# Patient Record
Sex: Male | Born: 1975 | Race: White | Hispanic: No | Marital: Single | Smoking: Current every day smoker
Health system: Southern US, Community
[De-identification: ages and names within clinical notes are randomized; demographics above are authoritative.]

## PROBLEM LIST (undated history)

## (undated) HISTORY — PX: FRACTURE SURGERY: SHX138

## (undated) HISTORY — PX: CHOLECYSTECTOMY: SHX55

## (undated) HISTORY — PX: OTHER SURGICAL HISTORY: SHX169

---

## 1998-09-20 ENCOUNTER — Emergency Department (HOSPITAL_COMMUNITY): Admission: EM | Admit: 1998-09-20 | Discharge: 1998-09-20 | Payer: Self-pay | Admitting: Emergency Medicine

## 1999-03-14 ENCOUNTER — Emergency Department (HOSPITAL_COMMUNITY): Admission: EM | Admit: 1999-03-14 | Discharge: 1999-03-14 | Payer: Self-pay | Admitting: *Deleted

## 1999-10-23 ENCOUNTER — Emergency Department (HOSPITAL_COMMUNITY): Admission: EM | Admit: 1999-10-23 | Discharge: 1999-10-23 | Payer: Self-pay | Admitting: Emergency Medicine

## 2000-02-05 ENCOUNTER — Emergency Department (HOSPITAL_COMMUNITY): Admission: EM | Admit: 2000-02-05 | Discharge: 2000-02-05 | Payer: Self-pay | Admitting: Emergency Medicine

## 2000-02-09 ENCOUNTER — Emergency Department (HOSPITAL_COMMUNITY): Admission: EM | Admit: 2000-02-09 | Discharge: 2000-02-09 | Payer: Self-pay | Admitting: Emergency Medicine

## 2000-08-21 ENCOUNTER — Emergency Department (HOSPITAL_COMMUNITY): Admission: EM | Admit: 2000-08-21 | Discharge: 2000-08-21 | Payer: Self-pay | Admitting: Emergency Medicine

## 2000-08-21 ENCOUNTER — Encounter: Payer: Self-pay | Admitting: Emergency Medicine

## 2000-10-31 ENCOUNTER — Encounter: Payer: Self-pay | Admitting: Emergency Medicine

## 2000-10-31 ENCOUNTER — Emergency Department (HOSPITAL_COMMUNITY): Admission: EM | Admit: 2000-10-31 | Discharge: 2000-10-31 | Payer: Self-pay | Admitting: Emergency Medicine

## 2001-08-20 ENCOUNTER — Emergency Department (HOSPITAL_COMMUNITY): Admission: EM | Admit: 2001-08-20 | Discharge: 2001-08-20 | Payer: Self-pay

## 2002-06-22 ENCOUNTER — Emergency Department (HOSPITAL_COMMUNITY): Admission: EM | Admit: 2002-06-22 | Discharge: 2002-06-22 | Payer: Self-pay | Admitting: Unknown Physician Specialty

## 2002-06-22 ENCOUNTER — Encounter: Payer: Self-pay | Admitting: Emergency Medicine

## 2002-07-24 ENCOUNTER — Emergency Department (HOSPITAL_COMMUNITY): Admission: EM | Admit: 2002-07-24 | Discharge: 2002-07-24 | Payer: Self-pay | Admitting: Emergency Medicine

## 2004-01-15 ENCOUNTER — Emergency Department (HOSPITAL_COMMUNITY): Admission: EM | Admit: 2004-01-15 | Discharge: 2004-01-15 | Payer: Self-pay | Admitting: Emergency Medicine

## 2004-03-19 ENCOUNTER — Emergency Department (HOSPITAL_COMMUNITY): Admission: EM | Admit: 2004-03-19 | Discharge: 2004-03-20 | Payer: Self-pay | Admitting: Emergency Medicine

## 2004-03-19 ENCOUNTER — Emergency Department (HOSPITAL_COMMUNITY): Admission: EM | Admit: 2004-03-19 | Discharge: 2004-03-19 | Payer: Self-pay | Admitting: Emergency Medicine

## 2004-06-18 ENCOUNTER — Emergency Department (HOSPITAL_COMMUNITY): Admission: EM | Admit: 2004-06-18 | Discharge: 2004-06-18 | Payer: Self-pay | Admitting: Emergency Medicine

## 2005-10-13 ENCOUNTER — Emergency Department (HOSPITAL_COMMUNITY): Admission: EM | Admit: 2005-10-13 | Discharge: 2005-10-13 | Payer: Self-pay | Admitting: Emergency Medicine

## 2006-03-21 ENCOUNTER — Emergency Department (HOSPITAL_COMMUNITY): Admission: EM | Admit: 2006-03-21 | Discharge: 2006-03-21 | Payer: Self-pay | Admitting: Emergency Medicine

## 2006-03-22 ENCOUNTER — Emergency Department (HOSPITAL_COMMUNITY): Admission: EM | Admit: 2006-03-22 | Discharge: 2006-03-22 | Payer: Self-pay | Admitting: Emergency Medicine

## 2006-10-14 ENCOUNTER — Emergency Department (HOSPITAL_COMMUNITY): Admission: EM | Admit: 2006-10-14 | Discharge: 2006-10-14 | Payer: Self-pay | Admitting: Emergency Medicine

## 2007-05-28 ENCOUNTER — Emergency Department (HOSPITAL_COMMUNITY): Admission: EM | Admit: 2007-05-28 | Discharge: 2007-05-28 | Payer: Self-pay | Admitting: Emergency Medicine

## 2007-11-11 ENCOUNTER — Emergency Department (HOSPITAL_COMMUNITY): Admission: EM | Admit: 2007-11-11 | Discharge: 2007-11-11 | Payer: Self-pay | Admitting: Emergency Medicine

## 2007-11-12 ENCOUNTER — Encounter (INDEPENDENT_AMBULATORY_CARE_PROVIDER_SITE_OTHER): Payer: Self-pay | Admitting: General Surgery

## 2007-11-12 ENCOUNTER — Observation Stay (HOSPITAL_COMMUNITY): Admission: EM | Admit: 2007-11-12 | Discharge: 2007-11-13 | Payer: Self-pay | Admitting: Emergency Medicine

## 2008-09-16 ENCOUNTER — Emergency Department (HOSPITAL_COMMUNITY): Admission: EM | Admit: 2008-09-16 | Discharge: 2008-09-17 | Payer: Self-pay | Admitting: Emergency Medicine

## 2008-09-21 ENCOUNTER — Encounter: Admission: RE | Admit: 2008-09-21 | Discharge: 2008-09-21 | Payer: Self-pay | Admitting: General Surgery

## 2008-09-22 ENCOUNTER — Encounter: Admission: RE | Admit: 2008-09-22 | Discharge: 2008-09-22 | Payer: Self-pay | Admitting: General Surgery

## 2009-01-21 ENCOUNTER — Emergency Department (HOSPITAL_COMMUNITY): Admission: EM | Admit: 2009-01-21 | Discharge: 2009-01-21 | Payer: Self-pay | Admitting: Emergency Medicine

## 2009-08-14 ENCOUNTER — Ambulatory Visit: Payer: Self-pay | Admitting: *Deleted

## 2009-08-14 ENCOUNTER — Emergency Department (HOSPITAL_COMMUNITY): Admission: EM | Admit: 2009-08-14 | Discharge: 2009-08-14 | Payer: Self-pay | Admitting: Emergency Medicine

## 2009-08-14 ENCOUNTER — Inpatient Hospital Stay (HOSPITAL_COMMUNITY): Admission: RE | Admit: 2009-08-14 | Discharge: 2009-08-17 | Payer: Self-pay | Admitting: *Deleted

## 2009-08-30 ENCOUNTER — Emergency Department (HOSPITAL_COMMUNITY): Admission: EM | Admit: 2009-08-30 | Discharge: 2009-08-30 | Payer: Self-pay | Admitting: Emergency Medicine

## 2009-09-01 ENCOUNTER — Emergency Department: Payer: Self-pay | Admitting: Emergency Medicine

## 2010-08-13 ENCOUNTER — Emergency Department (HOSPITAL_COMMUNITY): Admission: EM | Admit: 2010-08-13 | Discharge: 2010-08-13 | Payer: Self-pay | Admitting: Emergency Medicine

## 2011-04-06 LAB — CBC
HCT: 48.7 % (ref 39.0–52.0)
MCHC: 34.3 g/dL (ref 30.0–36.0)
MCV: 93 fL (ref 78.0–100.0)
Platelets: 320 10*3/uL (ref 150–400)
RBC: 5.24 MIL/uL (ref 4.22–5.81)
WBC: 12.6 10*3/uL — ABNORMAL HIGH (ref 4.0–10.5)

## 2011-04-06 LAB — ACETAMINOPHEN LEVEL: Acetaminophen (Tylenol), Serum: 25 ug/mL (ref 10–30)

## 2011-04-06 LAB — URINALYSIS, ROUTINE W REFLEX MICROSCOPIC
Bilirubin Urine: NEGATIVE
Glucose, UA: NEGATIVE mg/dL
Hgb urine dipstick: NEGATIVE
Ketones, ur: NEGATIVE mg/dL
Nitrite: NEGATIVE
Protein, ur: NEGATIVE mg/dL
Urobilinogen, UA: 0.2 mg/dL (ref 0.0–1.0)
pH: 6 (ref 5.0–8.0)

## 2011-04-06 LAB — COMPREHENSIVE METABOLIC PANEL
ALT: 22 U/L (ref 0–53)
AST: 21 U/L (ref 0–37)
Chloride: 105 mEq/L (ref 96–112)
Glucose, Bld: 85 mg/dL (ref 70–99)
Potassium: 4.1 mEq/L (ref 3.5–5.1)
Sodium: 139 mEq/L (ref 135–145)
Total Bilirubin: 0.4 mg/dL (ref 0.3–1.2)
Total Protein: 6.6 g/dL (ref 6.0–8.3)

## 2011-04-06 LAB — RAPID URINE DRUG SCREEN, HOSP PERFORMED
Amphetamines: NOT DETECTED
Barbiturates: NOT DETECTED
Cocaine: POSITIVE — AB
Opiates: NOT DETECTED

## 2011-04-06 LAB — DIFFERENTIAL
Eosinophils Relative: 1 % (ref 0–5)
Lymphocytes Relative: 20 % (ref 12–46)
Lymphs Abs: 2.6 10*3/uL (ref 0.7–4.0)

## 2011-04-27 ENCOUNTER — Emergency Department (HOSPITAL_COMMUNITY): Payer: Self-pay

## 2011-04-27 ENCOUNTER — Emergency Department (HOSPITAL_COMMUNITY)
Admission: EM | Admit: 2011-04-27 | Discharge: 2011-04-27 | Disposition: A | Discharge status: Home / Self Care | Payer: Self-pay | Attending: Emergency Medicine | Admitting: Emergency Medicine

## 2011-04-27 DIAGNOSIS — M545 Low back pain, unspecified: Secondary | ICD-10-CM | POA: Insufficient documentation

## 2011-04-27 DIAGNOSIS — Y929 Unspecified place or not applicable: Secondary | ICD-10-CM | POA: Insufficient documentation

## 2011-04-27 DIAGNOSIS — M546 Pain in thoracic spine: Secondary | ICD-10-CM | POA: Insufficient documentation

## 2011-04-27 DIAGNOSIS — W010XXA Fall on same level from slipping, tripping and stumbling without subsequent striking against object, initial encounter: Secondary | ICD-10-CM | POA: Insufficient documentation

## 2011-05-13 ENCOUNTER — Emergency Department (HOSPITAL_COMMUNITY)
Admission: EM | Admit: 2011-05-13 | Discharge: 2011-05-13 | Disposition: A | Discharge status: Home / Self Care | Payer: Self-pay | Attending: Emergency Medicine | Admitting: Emergency Medicine

## 2011-05-13 DIAGNOSIS — R51 Headache: Secondary | ICD-10-CM | POA: Insufficient documentation

## 2011-05-13 DIAGNOSIS — R220 Localized swelling, mass and lump, head: Secondary | ICD-10-CM | POA: Insufficient documentation

## 2011-05-13 DIAGNOSIS — W540XXA Bitten by dog, initial encounter: Secondary | ICD-10-CM | POA: Insufficient documentation

## 2011-05-13 DIAGNOSIS — R221 Localized swelling, mass and lump, neck: Secondary | ICD-10-CM | POA: Insufficient documentation

## 2011-05-13 DIAGNOSIS — Y929 Unspecified place or not applicable: Secondary | ICD-10-CM | POA: Insufficient documentation

## 2011-05-13 DIAGNOSIS — R234 Changes in skin texture: Secondary | ICD-10-CM | POA: Insufficient documentation

## 2011-05-13 LAB — DIFFERENTIAL
Basophils Absolute: 0.1 10*3/uL (ref 0.0–0.1)
Eosinophils Relative: 1 % (ref 0–5)
Neutrophils Relative %: 79 % — ABNORMAL HIGH (ref 43–77)

## 2011-05-13 LAB — BASIC METABOLIC PANEL
Chloride: 104 mEq/L (ref 96–112)
GFR calc Af Amer: >60 mL/min (ref >60)
Potassium: 4.2 mEq/L (ref 3.5–5.1)

## 2011-05-13 LAB — CBC
Hemoglobin: 16.5 g/dL (ref 13.0–17.0)
MCV: 90.2 fL (ref 78.0–100.0)
Platelets: 282 10*3/uL (ref 150–400)
RBC: 5.09 MIL/uL (ref 4.22–5.81)
WBC: 13 10*3/uL — ABNORMAL HIGH (ref 4.0–10.5)

## 2011-05-14 NOTE — Discharge Summary (Signed)
NAME:  B[place]man, [place]               ACCOUNT NO.:  [id number]   MEDICAL RECORD NO.:  [id number]          PATIENT TYPE:  IPS   LOCATION:  0305                          FACILITY:  [facility]   PHYSICIAN:  Jasmine Pang, M.D. DATE OF BIRTH:  03-02-1976   DATE OF ADMISSION:  08/14/2009  DATE OF DISCHARGE:  08/17/2009                               DISCHARGE SUMMARY   IDENTIFICATION:  This is a 35 year old single white male from  [place], who was admitted on August 14, 2009.   HISTORY OF PRESENT ILLNESS:  The patient is depressed with overdose on  sleep medications (over-the-counter).  He is stressed about the loss of  his son several years ago.  He works for [employer], which he stated very  stressful.  He has been depressed for several months it is worsened  recently.  He is suffering from anxiety.  He is also having mood swings  in addition to his depression.  He states he gets angry very quickly.  He went to [facility] in the past.  He uses alcohol 6-12 beers  daily and there is some occasional cocaine use.  He also complains of  some racing thoughts.  For further admission information, see  psychiatric admission assessment.  This is the first [facility] admission for  the patient.  He is in no current outpatient treatment.  Initially, he  was given an Axis I diagnosis of mood disorder NOS and also cocaine  abuse and alcohol abuse.  He is also given an Axis III diagnosis of a  history of headaches and history of ulcers.   PHYSICAL FINDINGS:  There were no acute physical abnormalities noted.  He was fully assessed in the ED prior to transfer here.   ADMISSION LABORATORY:  UDS was positive for cocaine.  Urinalysis was  negative.  Alcohol level was 8.  CBC was within normal limits.  CMET was  within normal limits.   HOSPITAL COURSE:  Upon admission, the patient was started on Risperdal 5  mg in the morning and 2 mg at h.s. due to his complaints, Librium 25 mg  p.o. q.4 h. p.r.n. anxiety or  withdrawal and Ambien 10 mg p.o. q.h.s. 1-  2 pills at bedtime if needed.  He was also started on Neurontin 100 mg  p.o. q.4 h. p.r.n. anxiety and Depakote 500 mg p.o. q.h.s.  He was also  started on Seroquel 50 mg p.o. q.4 h. p.r.n. anxiety.  Initially, the  patient presented as a somewhat depressed and anxious male.  He had  positive suicidal ideation upon admission, but this was resolving.  There was no homicidal ideation.  No evidence of a thought disorder or  paranoia.  On August 16, 2009, he stated he still not feeling well  sometimes I feel there are 2 with me.  He discussed hearing voices,  but admitted that he did not think they were real that it was more his  conversation with himself.  His mood was somewhat less depressed, less  anxious.  As hospitalization progressed, mental status improved.  He  stated I have a place  to go.  He wanted discharge on August 17, 2009.  Case manager gave him contact information to schedule an appointment at  [facility] for therapy as well as information regarding vocational  rehab in the [facility] for support and the [facility] warm line for peer  support.  His mood was less depressed, less anxious.  Affect was  consistent with mood.  There was no suicidal or homicidal ideation.  No  thoughts of self-injurious behavior.  No auditory or visual  hallucinations.  No paranoia or delusions.  Thoughts were logical and  goal-directed.  Thought content, no predominant theme.  Cognitive was  grossly intact.  Insight good.  Judgment good.  Impulse control good.  The patient wanted to go home today and was felt to be safe for  discharge.   DISCHARGE DIAGNOSES:  Axis I:  Mood disorder not otherwise specified and  also polysubstance abuse.  Axis II:  None.  Axis III:  History of headache and history of ulcer.  Axis IV:  Severe (problems with primary support group, problems related  to social environment, occupational problem, poor support, grief over  loss  of his son, burden of psychiatric and chemical abuse illness).  Axis V:  Global assessment of functioning was 50 at discharge.  GAF was  35 upon admission.  GAF was 60 highest past year.   DISCHARGE PLAN:  There was no specific activity level or dietary  restrictions.   POSTHOSPITAL CARE PLAN:  The patient is to go to 90 meetings in 90 days.  The patient will go to the [facility] on August 21, 2009 at 3  o'clock p.m.  He is also to call [facility] to schedule counseling.   DISCHARGE MEDICATIONS:  1. Depakote 500 mg at bedtime.  2. Risperdal 1 mg one-half tablet in the morning and 2 tablets at      bedtime.  3. Neurontin 100 mg every 4 hours as needed.      Jasmine Pang, M.D.  Electronically Signed     BHS/MEDQ  D:  08/29/2009  T:  08/30/2009  Job:  [id number]

## 2011-05-14 NOTE — H&P (Signed)
NAME:  Poehler, [place]               ACCOUNT NO.:  [id number]   MEDICAL RECORD NO.:  [id number]         PATIENT TYPE:  LINP   LOCATION:                               FACILITY:  [facility]   PHYSICIAN:  Lennie Muckle, MD      DATE OF BIRTH:  01-03-1976   DATE OF ADMISSION:  11/12/2007  DATE OF DISCHARGE:                              HISTORY & PHYSICAL   Mr. [name] Ramsey is a 35 year old male who was originally seen in the  emergency department yesterday at [facility] due to abdominal  and right upper quadrant pain.  He states he ate a ham biscuit from  [place] around 4 in the morning.  Twenty to thirty minutes later he  had severe abdominal pain.  On his arrival in the emergency room, he had  serum chemistries drawn which had a sodium and potassium of 141 and 5.2.  BUN and creatinine of 7 and 0.7.  ALT AST are normal at 21 and 24 and  lipase was normal.  White count was elevated at 19, hemoglobin and  hematocrit were 17 and 49.7.  He did receive IV pain medication.  However, due to his social situation he had to return home to take care  of his family.  He says he continued to have abdominal pain throughout  the day and returned back to the emergency room due to the continuation  of his abdominal pain.   He does describe intermittent episodes of right upper quadrant abdominal  pain after eating fried or greasy foods within the past month.  He does  have some nausea, no emesis, no diarrhea.  He has had no episodes of  jaundice or yellowing of the skin.  He does have a history of kidney  stones but relates that this pain is different.  He reports having no  fevers at home.  He did have an ultrasound performed in the emergency  department which showed multiple gallstones, a thickened gallbladder  wall, no pericholecystic fluid and there was a question of a starry-type  appearance to the liver consistent with hepatitis.   PAST MEDICAL HISTORY:  History of depression.   SURGICAL  HISTORY:  Ankle surgery.   He takes no daily medications.   He has no drug allergies.   SOCIAL HISTORY:  He smokes approximately 1/2 to 1 pack a day, occasional  alcohol use.   REVIEW OF SYSTEMS:  Obtained from the patient in the patient's emergency  room chart.  A 12-point system is negative.   PHYSICAL EXAMINATION:  GENERAL:  He is laying on a stretcher in no acute  distress.  He appears his stated age.  HEENT:  Extraocular muscles are intact.  No scleral icterus is evident.  NECK:  Supple.  CHEST:  Clear to auscultation bilaterally.  CARDIOVASCULAR:  Regular rate and rhythm.  ABDOMEN:  Soft.  A minimal amount of tenderness with palpation in the  epigastrium and right upper quadrant.  No peritoneal signs are noted.  No masses are palpated.  EXTREMITIES:  Without edema or deformity.  SKIN:  No rashes.  Skin  is warm and dry.  He has multiple tattoos on the  upper extremities, right and left.   A repeat white count today this morning at approximately 4 a.m. revealed  a white count of 20.6, hemoglobin and hematocrit of 17 and 50, platelets  of 394.   ASSESSMENT/PLAN:  Cholecystitis with cholelithiasis.  I discussed with  Mr. Lust that he needs to receive a cholecystectomy for true  definitive treatment of his disease process.  The risk of the surgery,  laparoscopic cholecystectomy with intraoperative cholangiogram, were  discussed with Mr. Yon.  Hopefully, he will be able to have this  performed by one of my partners today or in the near future.  His pain  has been relieved with Dilaudid and he has received Zofran.  We will  admit him with intravenous antibiotics and hopefully have him on the  operating room schedule today.  I will discuss this with Dr. Zachery Dakins  and my other partners to try and facilitate this.  I did discuss the  risk of hepatitis with the patient.  He does not have any intravenous  drug use history, no history of transfusions, and he does have a  tattoo  history.  I related to him that this will need to be monitored with  possibly having hepatitis panel drawn.  I will go ahead and draw a  hepatitis panel with this admission.      Lennie Muckle, MD  Electronically Signed     ALA/MEDQ  D:  11/12/2007  T:  11/12/2007  Job:  [id number]
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
## 2011-05-29 NOTE — Consult Note (Signed)
  NAME:  M[place], [place]               ACCOUNT NO.:  [id number]  MEDICAL RECORD NO.:  [id number]           PATIENT TYPE:  E  LOCATION:  [facility]                         FACILITY:  [facility]  PHYSICIAN:  Khyleigh Furney H. Pollyann Kennedy, MD     DATE OF BIRTH:  1976/10/16  DATE OF CONSULTATION:  05/13/2011 DATE OF DISCHARGE:  05/13/2011                                CONSULTATION   REASON FOR CONSULTATION:  Dog bite to the face 5 days ago and significant pain.  HISTORY:  This is a 36 year old who was bit in the upper lip by his boss' dog on Wednesday of last week.  He was treated at the [facility] Emergency Department by plastic surgeon who repaired a complex defect of the upper lip and nose.  He has been on Augmentin ever since.  He typically smokes, but has not smoked since this occurred and has been having trouble eating, but has been able to drink liquids.  No past medical history.  No significant surgical history.  PHYSICAL EXAMINATION:  Healthy-appearing gentleman in no distress.  His exam is limited to the face.  His upper lip is swollen with large amount of scabs on the outer surface.  There is no erythema of the skin, and there is no evidence of purulent discharge or fluctuance.  There are some abrasions of the nasal tip and some scabbing inside the nasal vestibule on the left.  The remainder of the face is unremarkable. Sutures are not visible.  IMPRESSION:  Status post complex laceration of the upper lip secondary to dog bite and status post repair of this by physician at [facility].  He lives in [place] and works in [place], so he would rather follow up here since he is not working currently.  He also did not very much like the plastic surgeon who worked on him, and he could not afford the co-paid followup in his office for additional treatment.  I offered that I would see him back in the office later in the week and if possible, remove the sutures.  I would not remove  them today because I think it is too soon, and he is extremely tender in the area.  I think it will be very difficult.  He is instructed to complete his Augmentin.  I have also instructed him on wound care including cleaning the skin with peroxide and applying a lot of antibiotic ointment.  He will follow up later in the week.  The emergency department staff will provide a prescription for some Vicodin, #30.     Teah Votaw H. Pollyann Kennedy, MD     JHR/MEDQ  D:  05/13/2011  T:  05/14/2011  Job:  [id number]  Electronically Signed by Serena Colonel MD on 05/29/2011 07:47:02 AM

## 2011-10-08 LAB — HEPATIC FUNCTION PANEL
ALT: 91 — ABNORMAL HIGH
AST: 71 — ABNORMAL HIGH
Albumin: 3.7
Bilirubin, Direct: 0.4 — ABNORMAL HIGH
Total Protein: 6.2

## 2011-10-08 LAB — DIFFERENTIAL
Basophils Absolute: 0
Basophils Absolute: 0.1
Eosinophils Relative: 0
Lymphocytes Relative: 7 — ABNORMAL LOW
Lymphocytes Relative: 9 — ABNORMAL LOW
Monocytes Absolute: 1.3 — ABNORMAL HIGH
Neutro Abs: 17 — ABNORMAL HIGH
Neutro Abs: 17.5 — ABNORMAL HIGH

## 2011-10-08 LAB — CBC
HCT: 45.7
HCT: 49.7
Hemoglobin: 15.9
Hemoglobin: 17.3 — ABNORMAL HIGH
MCHC: 34.8
MCV: 90.5
MCV: 90.9
RBC: 5.49
RDW: 14
RDW: 14.1
WBC: 19.1 — ABNORMAL HIGH

## 2011-10-08 LAB — COMPREHENSIVE METABOLIC PANEL
BUN: 7
CO2: 27
Chloride: 107
Creatinine, Ser: 0.76
GFR calc non Af Amer: >60
Glucose, Bld: 100 — ABNORMAL HIGH
Total Bilirubin: 0.8

## 2011-10-08 LAB — LIPASE, BLOOD: Lipase: 14

## 2012-02-03 ENCOUNTER — Encounter (HOSPITAL_COMMUNITY): Payer: Self-pay | Admitting: Emergency Medicine

## 2012-02-03 ENCOUNTER — Emergency Department (HOSPITAL_COMMUNITY)
Admission: EM | Admit: 2012-02-03 | Discharge: 2012-02-03 | Disposition: A | Discharge status: Home / Self Care | Payer: Self-pay | Attending: Emergency Medicine | Admitting: Emergency Medicine

## 2012-02-03 DIAGNOSIS — L0501 Pilonidal cyst with abscess: Secondary | ICD-10-CM | POA: Insufficient documentation

## 2012-02-03 DIAGNOSIS — IMO0001 Reserved for inherently not codable concepts without codable children: Secondary | ICD-10-CM | POA: Insufficient documentation

## 2012-02-03 MED ORDER — DOXYCYCLINE HYCLATE 100 MG PO CAPS: 100.0000 mg | ORAL_CAPSULE | Freq: Two times a day (BID) | ORAL | Status: DC

## 2012-02-03 MED ORDER — HYDROCODONE-ACETAMINOPHEN 5-325 MG PO TABS
1.0000 | ORAL_TABLET | Freq: Once | ORAL | Status: AC
  Administered 2012-02-03: 1 via ORAL
  Filled 2012-02-03: qty 1

## 2012-02-03 MED ORDER — HYDROCODONE-ACETAMINOPHEN 5-325 MG PO TABS: 1.0000 | ORAL_TABLET | ORAL | Status: DC | PRN

## 2012-02-03 NOTE — ED Provider Notes (Signed)
History     CSN: [id number]  Arrival date & time 02/03/12  1710   First MD Initiated Contact with Patient 02/03/12 2001      Chief Complaint  Patient presents with  . Abscess    (Consider location/radiation/quality/duration/timing/severity/associated sxs/prior treatment) HPI Comments: Patient with a history of frequent pilonidal abscesses - states they usually occur about once every 4-5 years - states recently they have been opening and draining on their own, but this one has been there about 2 weeks and has not drained - states increasing pain.  Patient is a 36 y.o. male presenting with abscess. The history is provided by the patient. No language interpreter was used.  Abscess  This is a new problem. The current episode started less than one week ago. The onset was gradual. The problem occurs occasionally. The problem has been unchanged. The abscess is present on the back. The problem is moderate. The abscess is characterized by redness, painfulness and swelling. The patient was exposed to OTC medications. The abscess first occurred at home. Associated symptoms include decreased sleep. Pertinent negatives include no anorexia, no decrease in physical activity, not drinking less, no fever, not sleeping more, no diarrhea, no vomiting, no congestion, no rhinorrhea, no sore throat, no decreased responsiveness and no cough. His past medical history is significant for skin abscesses in family. There were no sick contacts. He has received no recent medical care.    Past Medical History  Diagnosis Date  . Gastric ulcer     Past Surgical History  Procedure Date  . Fracture surgery   . Cholecystectomy     No family history on file.  History  Substance Use Topics  . Smoking status: Current Everyday Smoker  . Smokeless tobacco: Not on file  . Alcohol Use: No      Review of Systems  Constitutional: Negative for fever and decreased responsiveness.  HENT: Negative for congestion, sore  throat and rhinorrhea.   Respiratory: Negative for cough.   Gastrointestinal: Negative for vomiting, diarrhea and anorexia.  All other systems reviewed and are negative.    Allergies  Aspirin  Home Medications  No current outpatient prescriptions on file.  BP 128/68  Pulse 71  Temp(Src) 98.5 F (36.9 C) (Oral)  Resp 17  SpO2 97%  Physical Exam  Nursing note and vitals reviewed. Constitutional: He is oriented to person, place, and time. He appears well-developed and well-nourished. No distress.  HENT:  Head: Normocephalic and atraumatic.  Right Ear: External ear normal.  Left Ear: External ear normal.  Nose: Nose normal.  Mouth/Throat: Oropharynx is clear and moist. No oropharyngeal exudate.  Eyes: Conjunctivae are normal. Pupils are equal, round, and reactive to light. No scleral icterus.  Neck: Normal range of motion. Neck supple.  Cardiovascular: Normal rate, regular rhythm and normal heart sounds.  Exam reveals no gallop and no friction rub.   No murmur heard. Pulmonary/Chest: Effort normal and breath sounds normal. No respiratory distress.  Abdominal: Soft. Bowel sounds are normal. He exhibits no distension. There is no tenderness.  Musculoskeletal: Normal range of motion.  Lymphadenopathy:    He has no cervical adenopathy.  Neurological: He is alert and oriented to person, place, and time. No cranial nerve deficit.  Skin: Skin is warm and dry.     Psychiatric: He has a normal mood and affect. His behavior is normal. Judgment and thought content normal.    ED Course  Procedures (including critical care time)  Labs Reviewed - No data  to display No results found.  INCISION AND DRAINAGE Performed by: Cherrie Distance C. Consent: Verbal consent obtained. Risks and benefits: risks, benefits and alternatives were discussed Type: abscess  Body area: coccyx  Anesthesia: local infiltration  Local anesthetic: lidocaine 2% without epinephrine  Anesthetic total:  5 ml  Complexity: complex Blunt dissection to break up loculations  Drainage: purulent  Drainage amount: large  Packing material: 1/4 in iodoform gauze  Patient tolerance: Patient tolerated the procedure well with no immediate complications.  & Pilonidal abscess    MDM  Patient with complex pilonidal abscess, has history of same, no evidence of cellulitis or tracking to perirectal area, I&D with large drainage - wound packed and will return in 2 days for re-evaluation.        Izola Price [place], [place] 02/03/12 2201

## 2012-02-03 NOTE — ED Notes (Signed)
Report given to CDU RN; notified ED PA that pt is in CDU 9 and I&D tray at bedside

## 2012-02-03 NOTE — ED Notes (Signed)
Pt c/o abscess on coccyx area for approx 2 weeks.  St's has not had any drainage

## 2012-02-04 NOTE — ED Provider Notes (Signed)
Medical screening examination/treatment/procedure(s) were conducted as a shared visit with non-physician practitioner(s) and myself.  I personally evaluated the patient during the encounter    Celene Kras, MD 02/04/12 1058

## 2012-02-05 ENCOUNTER — Emergency Department (HOSPITAL_COMMUNITY)
Admission: EM | Admit: 2012-02-05 | Discharge: 2012-02-05 | Disposition: A | Discharge status: Home / Self Care | Payer: Self-pay | Attending: Emergency Medicine | Admitting: Emergency Medicine

## 2012-02-05 ENCOUNTER — Encounter (HOSPITAL_COMMUNITY): Payer: Self-pay | Admitting: *Deleted

## 2012-02-05 DIAGNOSIS — Z09 Encounter for follow-up examination after completed treatment for conditions other than malignant neoplasm: Secondary | ICD-10-CM | POA: Insufficient documentation

## 2012-02-05 DIAGNOSIS — Z4800 Encounter for change or removal of nonsurgical wound dressing: Secondary | ICD-10-CM | POA: Insufficient documentation

## 2012-02-05 DIAGNOSIS — Z5189 Encounter for other specified aftercare: Secondary | ICD-10-CM

## 2012-02-05 NOTE — ED Provider Notes (Signed)
Medical screening examination/treatment/procedure(s) were performed by non-physician practitioner and as supervising physician I was immediately available for consultation/collaboration.  Gerhard Munch, MD 02/05/12 2049

## 2012-02-05 NOTE — ED Notes (Signed)
Pt here for abscess follow up.  Reports that he was treated on Monday for the abscess above his buttocks.  No distress noted,  Reports that the pain medication is not working.

## 2012-02-05 NOTE — ED Provider Notes (Signed)
History     CSN: [id number]  Arrival date & time 02/05/12  1949   First MD Initiated Contact with Patient 02/05/12 2008      Chief Complaint  Patient presents with  . Abscess    (Consider location/radiation/quality/duration/timing/severity/associated sxs/prior treatment) HPI Comments: Patient is here to have packing removed from an abscess that was I&D 3 days ago on his  Patient is a 36 y.o. male presenting with abscess. The history is provided by the patient. The history is limited by a language barrier.  Abscess  The current episode started less than one week ago. The abscess is present on the back. The problem is mild. Pertinent negatives include no fever.    Past Medical History  Diagnosis Date  . Gastric ulcer     Past Surgical History  Procedure Date  . Fracture surgery   . Cholecystectomy     History reviewed. No pertinent family history.  History  Substance Use Topics  . Smoking status: Current Everyday Smoker  . Smokeless tobacco: Not on file  . Alcohol Use: No      Review of Systems  Constitutional: Negative for fever.  Genitourinary: Negative for flank pain.  Neurological: Negative for dizziness.    Allergies  Aspirin  Home Medications   Current Outpatient Rx  Name Route Sig Dispense Refill  . DOXYCYCLINE HYCLATE 100 MG PO CAPS Oral Take 100 mg by mouth 2 (two) times daily.    Marland Kitchen HYDROCODONE-ACETAMINOPHEN 5-325 MG PO TABS Oral Take 1 tablet by mouth every 4 (four) hours as needed. For pain.      BP 106/66  Pulse 60  Temp(Src) 97.9 F (36.6 C) (Oral)  Resp 16  SpO2 98%  Physical Exam  Constitutional: He appears well-developed and well-nourished.  HENT:  Head: Normocephalic.  Neck: Normal range of motion.  Cardiovascular: Normal rate.   Pulmonary/Chest: Effort normal.  Musculoskeletal: Normal range of motion.  Neurological: He is alert.  Skin:       Healing abscess drain remain in minimal redness.  Clear, serous drainage    ED  Course  Procedures (including critical care time)  Labs Reviewed - No data to display No results found.   1. Wound check, abscess       MDM   I&D abscess, recheck        Arman Filter, NP 02/05/12 2037

## 2012-03-17 ENCOUNTER — Encounter (HOSPITAL_COMMUNITY): Payer: Self-pay

## 2012-03-17 ENCOUNTER — Emergency Department (HOSPITAL_COMMUNITY)
Admission: EM | Admit: 2012-03-17 | Discharge: 2012-03-17 | Disposition: A | Discharge status: Home / Self Care | Payer: Self-pay | Attending: Emergency Medicine | Admitting: Emergency Medicine

## 2012-03-17 DIAGNOSIS — L0231 Cutaneous abscess of buttock: Secondary | ICD-10-CM | POA: Insufficient documentation

## 2012-03-17 DIAGNOSIS — L0291 Cutaneous abscess, unspecified: Secondary | ICD-10-CM

## 2012-03-17 MED ORDER — OXYCODONE-ACETAMINOPHEN 5-325 MG PO TABS: 2.0000 | ORAL_TABLET | ORAL | Status: AC | PRN

## 2012-03-17 MED ORDER — SULFAMETHOXAZOLE-TRIMETHOPRIM 800-160 MG PO TABS: 1.0000 | ORAL_TABLET | Freq: Two times a day (BID) | ORAL | Status: AC

## 2012-03-17 NOTE — ED Notes (Signed)
Pt. Just has an abscess treated recently on his buttocks it has returned.  Large red, swollen and started draining this am

## 2012-03-17 NOTE — ED Provider Notes (Signed)
History     CSN: [id number]  Arrival date & time 03/17/12  1710   First MD Initiated Contact with Patient 03/17/12 1937      Chief Complaint  Patient presents with  . Abscess    (Consider location/radiation/quality/duration/timing/severity/associated sxs/prior treatment) Patient is a 36 y.o. male presenting with abscess. The history is provided by the patient and a friend. No language interpreter was used.  Abscess  This is a recurrent problem. The current episode started yesterday. The onset was gradual. The problem has been gradually improving. The abscess is present on the right buttock. The problem is mild. The abscess is characterized by painfulness. The abscess first occurred at home. Pertinent negatives include no decrease in physical activity, no fever, no diarrhea and no vomiting.   Patient was here one month ago with a abscess to the same area between his buttock cheeks. Area today is 1 cm in size and no erythema present,  is cool to touch with a scab. No drainage visible.   Pt states the area drained on its own last pm a moderate amount.  Past Medical History  Diagnosis Date  . Gastric ulcer     Past Surgical History  Procedure Date  . Fracture surgery   . Cholecystectomy     No family history on file.  History  Substance Use Topics  . Smoking status: Current Everyday Smoker  . Smokeless tobacco: Not on file  . Alcohol Use: No      Review of Systems  Constitutional: Negative for fever and diaphoresis.  Respiratory: Negative for shortness of breath.   Gastrointestinal: Negative for nausea, vomiting, diarrhea and rectal pain.  Musculoskeletal: Negative for gait problem.  Skin:       Tender between buttocks  Neurological: Negative for dizziness, weakness and light-headedness.    Allergies  Aspirin  Home Medications  No current outpatient prescriptions on file.  BP 110/55  Pulse 87  Temp(Src) 98.7 F (37.1 C) (Oral)  Resp 18  Ht 5\' 5"  (1.651 m)   Wt 180 lb (81.647 kg)  BMI 29.95 kg/m2  SpO2 98%  Physical Exam  Constitutional: He is oriented to person, place, and time. He appears well-developed and well-nourished. No distress.  HENT:  Head: Normocephalic.  Eyes: Pupils are equal, round, and reactive to light.  Neck: Normal range of motion.  Cardiovascular: Normal rate.   Pulmonary/Chest: Effort normal.  Musculoskeletal: He exhibits tenderness. He exhibits no edema.  Neurological: He is alert and oriented to person, place, and time.  Skin: Skin is warm and dry.       Tenderness between buttocks no erythema or endurated area with fluctuance.    ED Course  Procedures (including critical care time)  Labs Reviewed - No data to display No results found.   No diagnosis found.    MDM  Small 1cm area of abscess recurrent from last month.  Too small to drain.  Septra ds.  Surgical consult if worse or return to ER.  No pcp.        Jethro Bastos, NP 03/18/12 1145

## 2012-03-17 NOTE — Discharge Instructions (Signed)
[name] Ramsey the abscess is only 1cm in size which is too small to drain.  Keep using hot compresses and squeeze tid.  Follow;up with the surgeon if worse.  Take the ibuprofen for pain.  Take percocet for severe pain but do not drive with this. Return if worse or any other concerns.  Abscess Care After An abscess (also called a boil or furuncle) is an infected area that contains a collection of pus. Signs and symptoms of an abscess include pain, tenderness, redness, or hardness, or you may feel a moveable soft area under your skin. An abscess can occur anywhere in the body. The infection may spread to surrounding tissues causing cellulitis. A cut (incision) by the surgeon was made over your abscess and the pus was drained out. Gauze may have been packed into the space to provide a drain that will allow the cavity to heal from the inside outwards. The boil may be painful for 5 to 7 days. Most people with a boil do not have high fevers. Your abscess, if seen early, may not have localized, and may not have been lanced. If not, another appointment may be required for this if it does not get better on its own or with medications. HOME CARE INSTRUCTIONS   Only take over-the-counter or prescription medicines for pain, discomfort, or fever as directed by your caregiver.   When you bathe, soak and then remove gauze or iodoform packs at least daily or as directed by your caregiver. You may then wash the wound gently with mild soapy water. Repack with gauze or do as your caregiver directs.  SEEK IMMEDIATE MEDICAL CARE IF:   You develop increased pain, swelling, redness, drainage, or bleeding in the wound site.   You develop signs of generalized infection including muscle aches, chills, fever, or a general ill feeling.   An oral temperature above 102 F (38.9 C) develops, not controlled by medication.  See your caregiver for a recheck if you develop any of the symptoms described above. If medications  (antibiotics) were prescribed, take them as directed. Document Released: 07/04/2005 Document Revised: 12/05/2011 Document Reviewed: 02/29/2008 [facility] Patient Information 2012 [place], [place].

## 2012-03-18 NOTE — ED Provider Notes (Signed)
Medical screening examination/treatment/procedure(s) were performed by non-physician practitioner and as supervising physician I was immediately available for consultation/collaboration.  Cheri Guppy, MD 03/18/12 [phone]

## 2012-07-29 ENCOUNTER — Emergency Department (HOSPITAL_COMMUNITY)
Admission: EM | Admit: 2012-07-29 | Discharge: 2012-07-29 | Disposition: A | Discharge status: Home / Self Care | Payer: No Typology Code available for payment source | Attending: Emergency Medicine | Admitting: Emergency Medicine

## 2012-07-29 ENCOUNTER — Emergency Department (HOSPITAL_COMMUNITY): Payer: No Typology Code available for payment source

## 2012-07-29 ENCOUNTER — Encounter (HOSPITAL_COMMUNITY): Payer: Self-pay | Admitting: *Deleted

## 2012-07-29 DIAGNOSIS — M545 Low back pain, unspecified: Secondary | ICD-10-CM | POA: Insufficient documentation

## 2012-07-29 DIAGNOSIS — R109 Unspecified abdominal pain: Secondary | ICD-10-CM | POA: Insufficient documentation

## 2012-07-29 DIAGNOSIS — S20219A Contusion of unspecified front wall of thorax, initial encounter: Secondary | ICD-10-CM | POA: Insufficient documentation

## 2012-07-29 DIAGNOSIS — R079 Chest pain, unspecified: Secondary | ICD-10-CM | POA: Insufficient documentation

## 2012-07-29 DIAGNOSIS — R404 Transient alteration of awareness: Secondary | ICD-10-CM | POA: Insufficient documentation

## 2012-07-29 DIAGNOSIS — M542 Cervicalgia: Secondary | ICD-10-CM | POA: Insufficient documentation

## 2012-07-29 LAB — POCT I-STAT, CHEM 8
Calcium, Ion: 1.21 mmol/L (ref 1.12–1.23)
Chloride: 107 mEq/L (ref 96–112)
HCT: 47 % (ref 39.0–52.0)
Sodium: 144 mEq/L (ref 135–145)

## 2012-07-29 LAB — COMPREHENSIVE METABOLIC PANEL
BUN: 11 mg/dL (ref 6–23)
Calcium: 8.6 mg/dL (ref 8.4–10.5)
Creatinine, Ser: 0.71 mg/dL (ref 0.50–1.35)
GFR calc Af Amer: >90 mL/min (ref >90)
GFR calc non Af Amer: >90 mL/min (ref >90)
Glucose, Bld: 88 mg/dL (ref 70–99)
Total Protein: 6.4 g/dL (ref 6.0–8.3)

## 2012-07-29 LAB — URINALYSIS, MICROSCOPIC ONLY
Glucose, UA: NEGATIVE mg/dL
Hgb urine dipstick: NEGATIVE
Leukocytes, UA: NEGATIVE
Specific Gravity, Urine: 1.042 — ABNORMAL HIGH (ref 1.005–1.030)
Urobilinogen, UA: 1 mg/dL (ref 0.0–1.0)

## 2012-07-29 LAB — CBC
HCT: 45.9 % (ref 39.0–52.0)
Hemoglobin: 15.7 g/dL (ref 13.0–17.0)
WBC: 14.2 10*3/uL — ABNORMAL HIGH (ref 4.0–10.5)

## 2012-07-29 LAB — PROTIME-INR
INR: 1 (ref 0.00–1.49)
Prothrombin Time: 13.4 seconds (ref 11.6–15.2)

## 2012-07-29 MED ORDER — HYDROMORPHONE HCL PF 1 MG/ML IJ SOLN
1.0000 mg | Freq: Once | INTRAMUSCULAR | Status: AC
  Administered 2012-07-29: 1 mg via INTRAVENOUS
  Filled 2012-07-29: qty 1

## 2012-07-29 MED ORDER — OXYCODONE-ACETAMINOPHEN 5-325 MG PO TABS: 1.0000 | ORAL_TABLET | ORAL | Status: AC | PRN

## 2012-07-29 MED ORDER — IOHEXOL 300 MG/ML  SOLN
100.0000 mL | Freq: Once | INTRAMUSCULAR | Status: AC | PRN
  Administered 2012-07-29: 100 mL via INTRAVENOUS

## 2012-07-29 MED ORDER — SODIUM CHLORIDE 0.9 % IV SOLN
10.0000 mg | Freq: Once | INTRAVENOUS | Status: DC
  Filled 2012-07-29 (×2): qty 1

## 2012-07-29 MED ORDER — ONDANSETRON HCL 4 MG/2ML IJ SOLN
4.0000 mg | Freq: Once | INTRAMUSCULAR | Status: AC
  Administered 2012-07-29: 4 mg via INTRAVENOUS
  Filled 2012-07-29: qty 2
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
## 2012-07-29 NOTE — ED Notes (Signed)
IV 18G LAC.

## 2012-07-31 NOTE — ED Provider Notes (Signed)
I saw and evaluated the patient, reviewed the resident's note and I agree with the findings and plan.  Mohamud Mrozek, MD 07/31/12 0805 

## 2012-11-12 ENCOUNTER — Encounter (HOSPITAL_COMMUNITY): Payer: Self-pay | Admitting: Emergency Medicine

## 2012-11-12 ENCOUNTER — Emergency Department (HOSPITAL_COMMUNITY)
Admission: EM | Admit: 2012-11-12 | Discharge: 2012-11-12 | Disposition: A | Discharge status: Home / Self Care | Payer: Self-pay | Attending: Emergency Medicine | Admitting: Emergency Medicine

## 2012-11-12 DIAGNOSIS — K259 Gastric ulcer, unspecified as acute or chronic, without hemorrhage or perforation: Secondary | ICD-10-CM | POA: Insufficient documentation

## 2012-11-12 DIAGNOSIS — K449 Diaphragmatic hernia without obstruction or gangrene: Secondary | ICD-10-CM | POA: Insufficient documentation

## 2012-11-12 DIAGNOSIS — R1013 Epigastric pain: Secondary | ICD-10-CM | POA: Insufficient documentation

## 2012-11-12 DIAGNOSIS — F17200 Nicotine dependence, unspecified, uncomplicated: Secondary | ICD-10-CM | POA: Insufficient documentation

## 2012-11-12 LAB — CBC WITH DIFFERENTIAL/PLATELET
Basophils Absolute: 0 10*3/uL (ref 0.0–0.1)
HCT: 48.1 % (ref 39.0–52.0)
Lymphocytes Relative: 19 % (ref 12–46)
Lymphs Abs: 1.6 10*3/uL (ref 0.7–4.0)
Neutro Abs: 5.6 10*3/uL (ref 1.7–7.7)
Platelets: 256 10*3/uL (ref 150–400)
RBC: 5.5 MIL/uL (ref 4.22–5.81)
RDW: 13.3 % (ref 11.5–15.5)
WBC: 8.1 10*3/uL (ref 4.0–10.5)

## 2012-11-12 LAB — COMPREHENSIVE METABOLIC PANEL
ALT: 26 U/L (ref 0–53)
AST: 24 U/L (ref 0–37)
Alkaline Phosphatase: 79 U/L (ref 39–117)
CO2: 25 mEq/L (ref 19–32)
Chloride: 103 mEq/L (ref 96–112)
GFR calc non Af Amer: >90 mL/min (ref >90)
Sodium: 138 mEq/L (ref 135–145)
Total Bilirubin: 0.3 mg/dL (ref 0.3–1.2)

## 2012-11-12 MED ORDER — GI COCKTAIL ~~LOC~~
30.0000 mL | Freq: Once | ORAL | Status: AC
  Administered 2012-11-12: 30 mL via ORAL
  Filled 2012-11-12: qty 30

## 2012-11-12 MED ORDER — OMEPRAZOLE 20 MG PO CPDR: 20.0000 mg | DELAYED_RELEASE_CAPSULE | Freq: Every day | ORAL | Status: DC

## 2012-11-12 MED ORDER — FAMOTIDINE 20 MG PO TABS: 20.0000 mg | ORAL_TABLET | Freq: Two times a day (BID) | ORAL | Status: DC

## 2012-11-12 MED ORDER — ONDANSETRON HCL 4 MG/2ML IJ SOLN
4.0000 mg | Freq: Once | INTRAMUSCULAR | Status: AC
  Administered 2012-11-12: 4 mg via INTRAVENOUS
  Filled 2012-11-12: qty 2

## 2012-11-12 MED ORDER — SODIUM CHLORIDE 0.9 % IV BOLUS (SEPSIS)
1000.0000 mL | Freq: Once | INTRAVENOUS | Status: AC
  Administered 2012-11-12: 1000 mL via INTRAVENOUS

## 2012-11-12 MED ORDER — SUCRALFATE 1 G PO TABS
1.0000 g | ORAL_TABLET | Freq: Once | ORAL | Status: AC
  Administered 2012-11-12: 1 g via ORAL
  Filled 2012-11-12: qty 1

## 2012-11-12 MED ORDER — MORPHINE SULFATE 4 MG/ML IJ SOLN
4.0000 mg | Freq: Once | INTRAMUSCULAR | Status: AC
  Administered 2012-11-12: 4 mg via INTRAVENOUS
  Filled 2012-11-12: qty 1

## 2012-11-12 MED ORDER — SUCRALFATE 1 G PO TABS: 1.0000 g | ORAL_TABLET | Freq: Four times a day (QID) | ORAL | Status: DC

## 2012-11-12 MED ORDER — PANTOPRAZOLE SODIUM 40 MG IV SOLR
40.0000 mg | Freq: Once | INTRAVENOUS | Status: AC
  Administered 2012-11-12: 40 mg via INTRAVENOUS
  Filled 2012-11-12: qty 40

## 2012-11-12 MED ORDER — FAMOTIDINE 20 MG PO TABS
40.0000 mg | ORAL_TABLET | Freq: Once | ORAL | Status: AC
  Administered 2012-11-12: 40 mg via ORAL
  Filled 2012-11-12: qty 1

## 2012-11-12 NOTE — ED Provider Notes (Signed)
History     CSN: [id number]  Arrival date & time 11/12/12  1205   First MD Initiated Contact with Patient 11/12/12 1234      Chief Complaint  Patient presents with  . Emesis    (Consider location/radiation/quality/duration/timing/severity/associated sxs/prior treatment) HPI 2 days ago patient developed epigastric pain along with severe nausea and vomiting. Patient states that he has vomited more than 10 times. The pain is rated at a 10 out of 10 and described as burning fire. The pain does not radiate. Patient states that the pain has gradually gotten worse but he has vomited less due to his decreased by mouth intake. Associated symptoms include no fever. He does report blood-streaked vomit. He took a Zantac last night that provided moderate relief. He has not received any medical care. Still passing gas.  Past Medical History  Diagnosis Date  . Gastric ulcer     Past Surgical History  Procedure Date  . Fracture surgery   . Cholecystectomy   . Ulcers     No family history on file.  History  Substance Use Topics  . Smoking status: Current Every Day Smoker  . Smokeless tobacco: Not on file  . Alcohol Use: Yes      Review of Systems Constitutional: Negative for fever.  Eyes: Negative for vision loss.  ENT: Negative for difficulty swallowing.  Cardiovascular: Negative for chest pain. Respiratory: Negative for respiratory distress.  Gastrointestinal:  Positive for vomiting.  Genitourinary: Negative for inability to void.  Musculoskeletal: Negative for gait problem.  Integumentary: Negative for rash.  Neurological: Negative for new focal weakness.     Allergies  Aspirin  Home Medications  No current outpatient prescriptions on file.  BP 127/71  Pulse 81  Temp 97.7 F (36.5 C) (Oral)  Resp 16  SpO2 97%  Physical Exam Nursing note and vitals reviewed.  Constitutional: Pt is alert and appears stated age. Eyes: No injection, no scleral icterus. HENT:  Atraumatic, airway open without erythema or exudate.  Respiratory: No respiratory distress. Equal breathing bilaterally. Cardiovascular: Normal rate. Extremities warm and well perfused.  Abdomen: Soft, epigastric, LUQ tenderness. No peritne MSK: Extremities are atraumatic without deformity. Skin: No rash, no wounds.   Neuro: No motor nor sensory deficit.     ED Course  Procedures (including critical care time)  Labs Reviewed - No data to display No results found.   Diagnosis 1. Abdominal pain 2. Emesis   MDM  36 y.o. male w/ PMHx of gastric ulcer, s/p chole presents w/ epigastric abdominal pain, emesis. Vital signs wnl. Benign abdominal exam. Plan for symptomatic care. Patient with previous history of gastric ulcer and states this feels similar. Patient has been passing gas so doubts small bowel obstruction with his benign abdominal exam. Will send labs including a lipase to assess for pancreatitis. Patient without overt signs of dehydration we'll give IV fluids. Will move to CDU.       I independently viewed, interpreted, and used in my medical decision making all ordered lab and imaging tests. Medical Decision Making discussed with ED attending Geoffery Lyons, MD          Charm Barges, MD 11/12/12 [phone]

## 2012-11-12 NOTE — ED Notes (Signed)
Onset 2 days ago abdominal pain above umbilicus emesis blood two day ago and now emesis is bile have not ate or drink anything. Pain 10/10 "fire"

## 2012-11-12 NOTE — ED Provider Notes (Signed)
2:04 PM Handoff from Dr. Gregary Cromer, resident. Patient to CDU with burning in chest, h/o PUD, hiatal hernia. Had N/V with some streaking of blood. Pain does not radiate. Patient smokes. Drinks approx 1 beer/day. No heavy NSAID use.   Exam:  Gen NAD; Heart RRR, bowel sounds heard over lower precordium, nml S1,S2, no m/r/g; Lungs CTAB; Abd soft, NT, no rebound or guarding; Ext 2+ pedal pulses bilaterally, no edema.  Plan: Some temporary relief with GI cocktail, symptoms now returning. Pepcid and protonix ordered.   3:17 PM Handoff to [facility] NP who will monitor and dispo per symptoms.   Renne Crigler, [place] 11/12/12 [phone]

## 2012-11-12 NOTE — ED Provider Notes (Signed)
Patient feeling better after several rounds of medication, pain currently controlled.  Prescriptions provided for carafate, pepcid, and omeprazole.  Patient should follow-up with unassigned GI ([place]).  Treatment and follow-up plan discussed with patient.  Jimmye Norman, NP 11/12/12 [phone]

## 2012-11-12 NOTE — ED Notes (Addendum)
Pt called out and reports his "burning in my throat and chest is starting to build back up".  Felicita Gage, PAC notified

## 2012-11-13 NOTE — ED Provider Notes (Signed)
Medical screening examination/treatment/procedure(s) were performed by non-physician practitioner and as supervising physician I was immediately available for consultation/collaboration.  Geoffery Lyons, MD 11/13/12 1001

## 2012-11-13 NOTE — ED Provider Notes (Signed)
I saw and evaluated the patient, reviewed the resident's note and I agree with the findings and plan.  I saw the patient along with Dr. Gregary Cromer.  The patient presents to the ED with the complaints of epigastric pain and vomiting.  He has a history of prior ulcers and states that this feels similar to that.  There are no fevers or chills.  There is no diarrhea or constipation and the last bm was today.    On exam, the patient appears uncomfortable.  The vitals are stable and he is afebrile.  The heart and lung exams are unremarkable.  There is ttp in the LUQ without rebound or guarding.  The skin has good turgor an mucous membranes are moist.    Fluids were initiated along with meds for pain and nausea.  Workup reveals labs that are reassuring.  He will be moved to the CDU for hydration, to be discharged if feeling better.  Does not look like pancreatitis, could be viral enteritis.   Geoffery Lyons, MD 11/13/12 [phone]

## 2014-03-29 ENCOUNTER — Emergency Department (HOSPITAL_COMMUNITY): Payer: Self-pay

## 2014-03-29 ENCOUNTER — Emergency Department (HOSPITAL_COMMUNITY)
Admission: EM | Admit: 2014-03-29 | Discharge: 2014-03-29 | Disposition: A | Discharge status: Home / Self Care | Payer: Self-pay | Attending: Emergency Medicine | Admitting: Emergency Medicine

## 2014-03-29 ENCOUNTER — Encounter (HOSPITAL_COMMUNITY): Payer: Self-pay | Admitting: Emergency Medicine

## 2014-03-29 DIAGNOSIS — R209 Unspecified disturbances of skin sensation: Secondary | ICD-10-CM | POA: Insufficient documentation

## 2014-03-29 DIAGNOSIS — M79602 Pain in left arm: Secondary | ICD-10-CM

## 2014-03-29 DIAGNOSIS — G5620 Lesion of ulnar nerve, unspecified upper limb: Secondary | ICD-10-CM | POA: Insufficient documentation

## 2014-03-29 DIAGNOSIS — F17200 Nicotine dependence, unspecified, uncomplicated: Secondary | ICD-10-CM | POA: Insufficient documentation

## 2014-03-29 DIAGNOSIS — M79601 Pain in right arm: Secondary | ICD-10-CM

## 2014-03-29 DIAGNOSIS — M79609 Pain in unspecified limb: Secondary | ICD-10-CM | POA: Insufficient documentation

## 2014-03-29 DIAGNOSIS — M6281 Muscle weakness (generalized): Secondary | ICD-10-CM | POA: Insufficient documentation

## 2014-03-29 LAB — BASIC METABOLIC PANEL
BUN: 15 mg/dL (ref 6–23)
CHLORIDE: 104 meq/L (ref 96–112)
CO2: 27 mEq/L (ref 19–32)
Calcium: 9.2 mg/dL (ref 8.4–10.5)
Creatinine, Ser: 0.66 mg/dL (ref 0.50–1.35)
GFR calc Af Amer: >90 mL/min (ref >90)
GFR calc non Af Amer: >90 mL/min (ref >90)
GLUCOSE: 76 mg/dL (ref 70–99)
POTASSIUM: 4.1 meq/L (ref 3.7–5.3)
SODIUM: 141 meq/L (ref 137–147)

## 2014-03-29 LAB — CBC
HEMATOCRIT: 47.6 % (ref 39.0–52.0)
HEMOGLOBIN: 16.6 g/dL (ref 13.0–17.0)
MCH: 32 pg (ref 26.0–34.0)
MCHC: 34.9 g/dL (ref 30.0–36.0)
MCV: 91.7 fL (ref 78.0–100.0)
Platelets: 295 10*3/uL (ref 150–400)
RBC: 5.19 MIL/uL (ref 4.22–5.81)
RDW: 13.5 % (ref 11.5–15.5)
WBC: 11.4 10*3/uL — AB (ref 4.0–10.5)

## 2014-03-29 MED ORDER — HYDROCODONE-ACETAMINOPHEN 5-325 MG PO TABS
2.0000 | ORAL_TABLET | Freq: Once | ORAL | Status: AC
  Administered 2014-03-29: 2 via ORAL
  Filled 2014-03-29: qty 2

## 2014-03-29 MED ORDER — HYDROCODONE-ACETAMINOPHEN 5-325 MG PO TABS: 1.0000 | ORAL_TABLET | ORAL | Status: DC | PRN

## 2014-03-29 NOTE — ED Notes (Signed)
Pt reports this has been going on for extended amount of time. Having numbness sensation to bilateral arms. Occurs more when sitting down and at night, unable to sleep. Reports numbness but also has severe pain to arms, difficulty grabbing objects and working. No acute distress noted at triage, no neuro deficits noted.

## 2014-03-29 NOTE — ED Notes (Signed)
MD at bedside. 

## 2014-03-29 NOTE — Discharge Instructions (Signed)
°Emergency Department Resource Guide °1) Find a Doctor and Pay Out of Pocket °Although you won't have to find out who is covered by your insurance plan, it is a good idea to ask around and get recommendations. You will then need to call the office and see if the doctor you have chosen will accept you as a new patient and what types of options they offer for patients who are self-pay. Some doctors offer discounts or will set up payment plans for their patients who do not have insurance, but you will need to ask so you aren't surprised when you get to your appointment. ° °2) Contact Your Local Health Department °Not all health departments have doctors that can see patients for sick visits, but many do, so it is worth a call to see if yours does. If you don't know where your local health department is, you can check in your phone book. The CDC also has a tool to help you locate your state's health department, and many state websites also have listings of all of their local health departments. ° °3) Find a Walk-in Clinic °If your illness is not likely to be very severe or complicated, you may want to try a walk in clinic. These are popping up all over the country in pharmacies, drugstores, and shopping centers. They're usually staffed by nurse practitioners or physician assistants that have been trained to treat common illnesses and complaints. They're usually fairly quick and inexpensive. However, if you have serious medical issues or chronic medical problems, these are probably not your best option. ° °No Primary Care Doctor: °- Call [facility] at  832-8000 - they can help you locate a primary care doctor that  accepts your insurance, provides certain services, etc. °- Physician Referral Service- 1-800-533-3463 ° °Chronic Pain Problems: °Organization         Address  Phone   Notes  °[facility]  (336) 297-2271 Patients need to be referred by their primary care doctor.  ° °Medication  Assistance: °Organization         Address  Phone   Notes  °[place] [facility] [address] °[place], [place] [address] (336) 641-8030 --Must be a resident of [place] °-- Must have NO insurance coverage whatsoever (no Medicaid/ Medicare, etc.) °-- The pt. MUST have a primary care doctor that directs their care regularly and follows them in the community °  °[facility]  (866) 331-1348   °[employer]  (888) 892-1162   ° °Agencies that provide inexpensive medical care: °Organization         Address  Phone   Notes  °[facility]  (336) 832-8035   °[facility]    (336) 832-7272   °[facility] [address] °[place], [place] [address] (336) 832-4777   °[facility] [address], °[place] (336) 271-4999   °[facility]    (336) 373-0678   °[facility]    (336) 272-1050   °[facility] ° [address], [place] Phone:  (336) 832-4444, Fax:  (336) 832-4440 Hours of Operation:  9 am - 6 pm, M-F.  Also accepts Medicaid/Medicare and self-pay.  °[facility] ° [address],  Phone: (336) 832-3150, Fax: (336) 832-3151. Hours of Operation:  8:30 am - 5:30 pm, M-F.  Also accepts Medicaid and self-pay.  °[facility] [place] [address], [place] Phone: (336) 878-6027   °[facility] [address], [place], [place] (336)723-1848, Ext. 123 Mondays & Thursdays: 7-9 AM.  First 15 patients are seen on a first come, first serve basis. °  ° °Medicaid-accepting [place] Providers: ° °Organization         Address  Phone   Notes  °[facility] [address], [place] (336) 641-2100 Also accepts self-pay patients.  °[facility] [address], [place] ° (336) 856-9996   °[facility] [address], [place]  (336) 288-8857   °[facility] [address] [place] [address], [place] (336) 299-7000   °Veita Bland [address], [place]  ° (336) 373-1557 Only accepts [place] Access Medicaid patients after they have their name applied to their card.  ° °Self-Pay (no insurance) in [place]: ° °Organization         Address  Phone   Notes  °Sickle Cell Patients, [facility] [address], [place] (336) 832-1970   °[facility] [address], [place] (336) 832-4400   °[facility] ° [address], [place] (336) 992-4800   °[facility]/Dr. Osei-Bonsu ° [address] [place] [address], [place] or [address], [place] (336) 841-8500 Phone number for both [place] and [place] locations is the same.  °[facility] [address], [place] (336) 299-0000   °[facility] [address] [place] [address], [place] or [address] (336) 852-7530 °(336) 878-2260   °[facility] [address], [place] (336) 350-1642, phone; (336) 294-5005, fax Sees patients 1st and 3rd Saturday of every month.  Must not qualify for public or private insurance (i.e. Medicaid, Medicare, [place] Health Choice, Veterans' Benefits) • Household income should be no more than 200% of the poverty level •The clinic cannot treat you if you are pregnant or think you are pregnant • Sexually transmitted diseases are not treated at the clinic.  ° ° °Dental Care: °Organization         Address  Phone  Notes  °[place] [facility] [address], [place] (336) 641-6152 Accepts children up to age 21 who are enrolled in Medicaid or [place] Health Choice; pregnant women with a Medicaid card; and children who have applied for Medicaid or [place] Health Choice, but were declined, whose parents can pay a reduced fee at time of service.  °[place]  [facility] [place]  [address], [place] (336) 641-7733 Accepts children up to age 21 who are enrolled in Medicaid or [place] Health Choice; pregnant women with a Medicaid card; and children who have applied for Medicaid or [place] Health Choice, but were declined, whose parents can pay a reduced fee at time of service.  °[facility] ° [address], [place] (336) 641-4533 Patients are seen by appointment only. Walk-ins are not accepted. [facility] will see patients 18 years of age and older. °Monday - Tuesday (8am-5pm) °Most Wednesdays (8:30-5pm) °$30 per visit, cash only  °[facility] ° [address], [place] (336) 641-4533 Patients are seen by appointment only. Walk-ins are not accepted. [facility] will see patients 18 years of age and older. °One   Wednesday Evening (Monthly: Volunteer Based).  $30 per visit, cash only  °[facility]  (919) 537-3737 for adults; Children under age 4, call Graduate Pediatric Dentistry at (919) [phone]. Children aged 4-14, please call (919) 537-3737 to request a pediatric application. ° Dental services are provided in all areas of dental care including fillings, crowns and bridges, complete and partial dentures, implants, gum treatment, root canals, and extractions. Preventive care is also provided. Treatment is provided to both adults and children. °Patients are selected via a lottery and there is often a waiting list. °  °[facility] [address], °[place] ° (336) 763-8833 [email or web address] °  °[facility] [address], [place], [place] (336)723-1848, Ext. 123 Second and Fourth Thursday of each month, opens at 6:30 AM; Clinic ends at 9 AM.  Patients are seen on a first-come first-served basis, and a limited number are seen during each clinic.  ° °[facility] ° [address], [place], [place] (336) 723-7904    Eligibility Requirements °You must have lived in [place], [place], or [place] counties for at least the last three months. °  You cannot be eligible for state or federal sponsored healthcare insurance, including Veterans Administration, Medicaid, or Medicare. °  You generally cannot be eligible for healthcare insurance through your employer.  °  How to apply: °Eligibility screenings are held every Tuesday and Wednesday afternoon from 1:00 pm until 4:00 pm. You do not need an appointment for the interview!  °[facility] [address], [place], [place] 336-631-2330   °[facility]  336-342-8273   °[facility]  336-703-3100   °[facility]  336-570-6415   ° °Behavioral Health Resources in the Community: °Intensive Outpatient Programs °Organization         Address  Phone  Notes  °[facility] [address], [place], [place] 336-878-6098   °[facility] [address], [place], [place] 336-832-9800   °[facility] [address], [place], [place] ° 336-882-2125   °[facility] [address],  °[place], [place] 1-800-853-5163 or 336-641-4981   °Substance Abuse Resources °Organization         Address  Phone  Notes  °[facility]  336-882-2125   °[facility]  336-784-9470   °[facility]  336-285-9073   °[facility]  336-845-3988   °Residential & Outpatient Substance Abuse Program  1-800-659-3381   °Psychological Services °Organization         Address  Phone  Notes  °[facility]  336- 832-9600   °[facility]  336- 378-7881   °[facility] [address], [place] 1-800-853-5163 or 336-641-4981   ° °Mobile Crisis Teams °Organization         Address  Phone  Notes  °[facility], Mobile Crisis Care Unit  1-877-626-1772   °Assertive °[facility] ° [address]  [place], [place] 336-834-9664   °Sharon DeEsch [address] °[place]  336-554-5454   ° °Self-Help/Support Groups °Organization         Address  Phone             Notes  °[facility] [place] - variety of support groups  336- 373-1402 Call for more information  °Narcotics Anonymous (NA), [facility] [address], °[place]   2 meetings at this location  ° °  Residential Treatment Programs °Organization         Address  Phone  Notes  °[facility] [address],    °[place]  1-866-801-8205   °[facility] ° [address], [place], [place] 704-293-8524   °[facility] [address], [place] 336-845-3988 Admissions: 8am-3pm M-F  °[facility] [address]    °[place], [place] 336-841-1104   °[facility] [address], [place], [place] 336-379-7146   °[facility] [address]  °[place], [place] 336-285-9073   °[facility] Intensive Outpatient [address], [place], [place] 336-852-3033   °[facility] [address]  °[place], [place] 1-877-615-2722 or 336-784-9470   °[facility] [address] [place], [place] 336-227-7417 Accepts Medicaid  °[facility] [address]  ° [place] 1-800-659-3381 Substance Abuse/Addiction Treatment  ° °[place] Behavioral Health Resources °Organization         Address  Phone  Notes  °[facility]  (888) 581-9988   °Julie Brannon, PhD [address] [place], [place]   (336) 349-5553 or (336) 951-0000   °[facility]   [address] °[place], [place] (336) 349-4454   °[facility] [address], [place], [place] (336) 342-8316 Insurance/Medicaid/sponsorship through [facility]  °[facility] [address]                                    [place], [place] (336) 342-8316 Therapy/tele-psych/case    °[facility] [address]  ° [place], [place] (336) 349-2233    °Dr. Arfeen  (336) 349-4544   °[facility] [place]  [employer] [place] [facility] 1) [address], [place] °2) [address], [place] °3)  [address], [place] (336) 349-3220 °(336) 342-7768 ° °(336) 342-8140   °[place] [facility] (336) 342-1394 or (336) 342-3537 (After Hours)    ° ° °

## 2014-03-29 NOTE — ED Provider Notes (Signed)
CSN: [id number]     Arrival date & time 03/29/14  1359 History   First MD Initiated Contact with Patient 03/29/14 1646     Chief Complaint  Patient presents with  . Numbness  . Arm Pain     (Consider location/radiation/quality/duration/timing/severity/associated sxs/prior Treatment) HPI 38 year old male presents with several months bilateral arm pain. The pain is worst in his elbows and in his hands. It is nothing to go higher than his elbows. No neck pain, headache, or blurred vision. No leg symptoms. He states she's been starting to drop things at work and his pain seems to be getting worse over the past few days. There's been no injuries. The pain is most severe at night and wakes him up out of sleep. During the day he gradually gets better. He does feel numb in his pinky and ring finger. He feels like he has a hard time fully closing his hand. The pain as a 9/10. Was taking ibuprofen at home without much success. He does not have a primary care physician at this time.  Past Medical History  Diagnosis Date  . Gastric ulcer    Past Surgical History  Procedure Laterality Date  . Fracture surgery    . Cholecystectomy    . Ulcers     History reviewed. No pertinent family history. History  Substance Use Topics  . Smoking status: Current Every Day Smoker  . Smokeless tobacco: Not on file  . Alcohol Use: Yes    Review of Systems  Respiratory: Negative for shortness of breath.   Cardiovascular: Negative for chest pain.  Musculoskeletal: Negative for back pain, neck pain and neck stiffness.  Neurological: Positive for weakness and numbness. Negative for dizziness and headaches.  All other systems reviewed and are negative.      Allergies  Aspirin  Home Medications   Current Outpatient Rx  Name  Route  Sig  Dispense  Refill  . ibuprofen (ADVIL,MOTRIN) 200 MG tablet   Oral   Take 200 mg by mouth every 6 (six) hours as needed for moderate pain.          BP 115/63   Pulse 57  Temp(Src) 98.2 F (36.8 C) (Oral)  Resp 19  SpO2 96% Physical Exam  Nursing note and vitals reviewed. Constitutional: He is oriented to person, place, and time. He appears well-developed and well-nourished.  HENT:  Head: Normocephalic and atraumatic.  Right Ear: External ear normal.  Left Ear: External ear normal.  Nose: Nose normal.  Eyes: Right eye exhibits no discharge. Left eye exhibits no discharge.  Neck: Neck supple.  Cardiovascular: Normal rate, regular rhythm, normal heart sounds and intact distal pulses.   Pulmonary/Chest: Effort normal and breath sounds normal.  Abdominal: Soft. There is no tenderness.  Musculoskeletal: He exhibits no edema.  Holding both hands with all 10 fingers slightly flexed. Decreased sensation to pinky and 4th finger bilaterally. Decreased movement of hand for all movements. No swelling. No reproducible pain with wrist palpation. Does have tenderness over elbow groove near ulnar nerve. No evidence of muscle wasting  Neurological: He is alert and oriented to person, place, and time.  Skin: Skin is warm and dry.    ED Course  Procedures (including critical care time) Labs Review Labs Reviewed  CBC - Abnormal; Notable for the following:    WBC 11.4 (*)    All other components within normal limits  BASIC METABOLIC PANEL   Imaging Review Dg Elbow Complete Left  03/29/2014  CLINICAL DATA:  Elbow pain, paresthesias  EXAM: LEFT ELBOW - COMPLETE 3+ VIEW  COMPARISON:  03/29/2014  FINDINGS: There is no evidence of fracture, dislocation, or joint effusion. There is no evidence of arthropathy or other focal bone abnormality. Soft tissues are unremarkable.  IMPRESSION: Negative.   Electronically Signed   By: Ruel Favors M.D.   On: 03/29/2014 18:25   Dg Elbow Complete Right  03/29/2014   CLINICAL DATA:  Right elbow pain, paresthesias  EXAM: RIGHT ELBOW - COMPLETE 3+ VIEW  COMPARISON:  03/29/2014  FINDINGS: There is no evidence of fracture,  dislocation, or joint effusion. There is no evidence of arthropathy or other focal bone abnormality. Soft tissues are unremarkable.  IMPRESSION: Negative.   Electronically Signed   By: Ruel Favors M.D.   On: 03/29/2014 18:25     EKG Interpretation   Date/Time:  Tuesday March 29 2014 16:44:26 EDT Ventricular Rate:  63 PR Interval:  123 QRS Duration: 107 QT Interval:  418 QTC Calculation: 428 R Axis:   53 Text Interpretation:  Sinus rhythm Baseline wander in lead(s) V2 no acute  ischemia No significant change since last tracing Confirmed by Maverik Foot   MD, Erla Bacchi ([id number]) on 03/29/2014 4:48:36 PM      MDM   Final diagnoses:  Bilateral arm pain  Ulnar nerve compression    Patient has acute on chronic pain in his bilateral hands and elbows. Given the distribution of sensory changes is most consistent with ulnar entrapment. This would make sense with his frequent lifting at work and use of his elbows. He has no focal neuro deficits. He has no neck pain or lower extremity symptoms. I highly doubt CNS cause of his symptoms. We'll treat his pain, refer him for a PCP, and also refer for a hand surgeon for further evaluation.    Audree Camel, MD 03/29/14 2352

## 2014-08-19 ENCOUNTER — Encounter (HOSPITAL_COMMUNITY): Payer: Self-pay | Admitting: Emergency Medicine

## 2014-08-19 ENCOUNTER — Emergency Department (HOSPITAL_COMMUNITY)
Admission: EM | Admit: 2014-08-19 | Discharge: 2014-08-19 | Disposition: A | Discharge status: Home / Self Care | Payer: No Typology Code available for payment source | Attending: Emergency Medicine | Admitting: Emergency Medicine

## 2014-08-19 DIAGNOSIS — F17200 Nicotine dependence, unspecified, uncomplicated: Secondary | ICD-10-CM | POA: Insufficient documentation

## 2014-08-19 DIAGNOSIS — Z8719 Personal history of other diseases of the digestive system: Secondary | ICD-10-CM | POA: Insufficient documentation

## 2014-08-19 DIAGNOSIS — K029 Dental caries, unspecified: Secondary | ICD-10-CM | POA: Insufficient documentation

## 2014-08-19 DIAGNOSIS — K089 Disorder of teeth and supporting structures, unspecified: Secondary | ICD-10-CM | POA: Insufficient documentation

## 2014-08-19 MED ORDER — PENICILLIN V POTASSIUM 500 MG PO TABS: 500.0000 mg | ORAL_TABLET | Freq: Three times a day (TID) | ORAL | Status: DC

## 2014-08-19 MED ORDER — IBUPROFEN 800 MG PO TABS: 800.0000 mg | ORAL_TABLET | Freq: Three times a day (TID) | ORAL | Status: DC

## 2014-08-19 MED ORDER — HYDROCODONE-ACETAMINOPHEN 5-325 MG PO TABS
1.0000 | ORAL_TABLET | Freq: Four times a day (QID) | ORAL | Status: DC | PRN
Start: 2014-08-19 — End: 2015-03-23

## 2014-08-19 NOTE — Discharge Instructions (Signed)
Dental Pain °A tooth ache may be caused by cavities (tooth decay). Cavities expose the nerve of the tooth to air and hot or cold temperatures. It may come from an infection or abscess (also called a boil or furuncle) around your tooth. It is also often caused by dental caries (tooth decay). This causes the pain you are having. °DIAGNOSIS  °Your caregiver can diagnose this problem by exam. °TREATMENT  °· If caused by an infection, it may be treated with medications which kill germs (antibiotics) and pain medications as prescribed by your caregiver. Take medications as directed. °· Only take over-the-counter or prescription medicines for pain, discomfort, or fever as directed by your caregiver. °· Whether the tooth ache today is caused by infection or dental disease, you should see your dentist as soon as possible for further care. °SEEK MEDICAL CARE IF: °The exam and treatment you received today has been provided on an emergency basis only. This is not a substitute for complete medical or dental care. If your problem worsens or new problems (symptoms) appear, and you are unable to meet with your dentist, call or return to this location. °SEEK IMMEDIATE MEDICAL CARE IF:  °· You have a fever. °· You develop redness and swelling of your face, jaw, or neck. °· You are unable to open your mouth. °· You have severe pain uncontrolled by pain medicine. °MAKE SURE YOU:  °· Understand these instructions. °· Will watch your condition. °· Will get help right away if you are not doing well or get worse. °Document Released: 12/16/2005 Document Revised: 03/09/2012 Document Reviewed: 08/03/2008 °ExitCare® Patient Information ©2015 [employer]. This information is not intended to replace advice given to you by your health care provider. Make sure you discuss any questions you have with your health care provider. ° °Emergency Department Resource Guide °1) Find a Doctor and Pay Out of Pocket °Although you won't have to find out who  is covered by your insurance plan, it is a good idea to ask around and get recommendations. You will then need to call the office and see if the doctor you have chosen will accept you as a new patient and what types of options they offer for patients who are self-pay. Some doctors offer discounts or will set up payment plans for their patients who do not have insurance, but you will need to ask so you aren't surprised when you get to your appointment. ° °2) Contact Your Local Health Department °Not all health departments have doctors that can see patients for sick visits, but many do, so it is worth a call to see if yours does. If you don't know where your local health department is, you can check in your phone book. The CDC also has a tool to help you locate your state's health department, and many state websites also have listings of all of their local health departments. ° °3) Find a Walk-in Clinic °If your illness is not likely to be very severe or complicated, you may want to try a walk in clinic. These are popping up all over the country in pharmacies, drugstores, and shopping centers. They're usually staffed by nurse practitioners or physician assistants that have been trained to treat common illnesses and complaints. They're usually fairly quick and inexpensive. However, if you have serious medical issues or chronic medical problems, these are probably not your best option. ° °No Primary Care Doctor: °- Call [facility] at  832-8000 - they can help you locate a primary   care doctor that  accepts your insurance, provides certain services, etc. °- Physician Referral Service- 1-800-533-3463 ° °Chronic Pain Problems: °Organization         Address  Phone   Notes  °[facility]  (336) 297-2271 Patients need to be referred by their primary care doctor.  ° °Medication Assistance: °Organization         Address  Phone   Notes  °[place] [facility] [address]  [address] °[place], [place] [address] (336) 641-8030 --Must be a resident of [place] °-- Must have NO insurance coverage whatsoever (no Medicaid/ Medicare, etc.) °-- The pt. MUST have a primary care doctor that directs their care regularly and follows them in the community °  °[facility]  (866) 331-1348   °[employer]  (888) 892-1162   ° °Agencies that provide inexpensive medical care: °Organization         Address  Phone   Notes  °[facility]  (336) 832-8035   °[facility]    (336) 832-7272   °[facility] [address] °[place], [place] [address] (336) 832-4777   °[facility] [address], °[place] (336) 271-4999   °[facility]    (336) 373-0678   °[facility]    (336) 272-1050   °[facility] ° [address], [place] Phone:  (336) 832-4444, Fax:  (336) 832-4440 Hours of Operation:  9 am - 6 pm, M-F.  Also accepts Medicaid/Medicare and self-pay.  °[facility] ° [address], [place] Phone: (336) 832-3150, Fax: (336) 832-3151. Hours of Operation:  8:30 am - 5:30 pm, M-F.  Also accepts Medicaid and self-pay.  °[facility] [place] [address], [place] Phone: (336) 878-6027   °[facility] [address], [place], [place] (336)723-1848, Ext. 123 Mondays & Thursdays: 7-9 AM.  First 15 patients are seen on a first come, first serve basis. °  ° °Medicaid-accepting [place] Providers: ° °Organization         Address  Phone   Notes  °[facility] [address], [place] (336) 641-2100 Also accepts self-pay patients.  °[facility] [address], [place] ° (336) 856-9996   °[facility] [address], [place] (336) 288-8857   °[facility] [address] [place] [address], [place] (336) 299-7000   °Veita Bland [address], [place]  ° (336) 373-1557 Only accepts [place] Access Medicaid patients after they have their name applied to their card.  ° °Self-Pay (no insurance) in [place]: ° °Organization         Address  Phone   Notes  °Sickle Cell Patients, [facility] [address], [place] (336) 832-1970   °[facility] [address], [place] (336) 832-4400   ° [facility] ° [address], [place] (336) 992-4800   °[facility]/Dr. Osei-Bonsu ° [address] [place] [address], [place] or [address], [place] (336) 841-8500 Phone number for both [place] and [place] locations is the same.  °[facility] [address], [place] (336) 299-0000   °[facility] [address] [place] [address], [place] or [address] (336) 852-7530 °(336) 878-2260   °  [facility] [address], [place] (336) 350-1642, phone; (336) 294-5005, fax Sees patients 1st and 3rd Saturday of every month.  Must not qualify for public or private insurance (i.e. Medicaid, Medicare, [place] Health Choice, Veterans' Benefits) • Household income should be no more than 200% of the poverty level •The clinic cannot treat you if you are pregnant or think you are pregnant • Sexually transmitted diseases are not treated at the clinic.  ° ° °Dental Care: °Organization         Address  Phone  Notes  °[facility] [address], [place] (336) 641-6152 Accepts children up to age 21 who are enrolled in Medicaid or [place] Health Choice; pregnant women with a Medicaid card; and children who have applied for Medicaid or [place] Health Choice, but were declined, whose parents can pay a reduced fee at time of service.  °[facility] [place]  [address], [place] (336) 641-7733 Accepts children up to age 21 who are  enrolled in Medicaid or [place] Health Choice; pregnant women with a Medicaid card; and children who have applied for Medicaid or [place] Health Choice, but were declined, whose parents can pay a reduced fee at time of service.  °[facility] ° [address], [place] (336) 641-4533 Patients are seen by appointment only. Walk-ins are not accepted. [facility] will see patients 18 years of age and older. °Monday - Tuesday (8am-5pm) °Most Wednesdays (8:30-5pm) °$30 per visit, cash only  °[facility] ° [address], [place] (336) 641-4533 Patients are seen by appointment only. Walk-ins are not accepted. [facility] will see patients 18 years of age and older. °One Wednesday Evening (Monthly: Volunteer Based).  $30 per visit, cash only  °[facility]  (919) 537-3737 for adults; Children under age 4, call Graduate Pediatric Dentistry at (919) [phone]. Children aged 4-14, please call (919) 537-3737 to request a pediatric application. ° Dental services are provided in all areas of dental care including fillings, crowns and bridges, complete and partial dentures, implants, gum treatment, root canals, and extractions. Preventive care is also provided. Treatment is provided to both adults and children. °Patients are selected via a lottery and there is often a waiting list. °  °[facility] [address], °[place] ° (336) 763-8833 [email or web address] °  °[facility] [address], [place], [place] (336)723-1848, Ext. 123 Second and Fourth Thursday of each month, opens at 6:30 AM; Clinic ends at 9 AM.  Patients are seen on a first-come first-served basis, and a limited number are seen during each clinic.  ° °[facility] ° [address], [place], [place] (336) 723-7904   Eligibility Requirements °You must have lived in [place], [place], or [place] counties for at least the last three months. °  You  cannot be eligible for state or federal sponsored healthcare insurance, including Veterans Administration, Medicaid, or Medicare. °  You generally cannot be eligible for healthcare insurance through your employer.  °  How to apply: °Eligibility screenings are held every Tuesday and Wednesday afternoon from 1:00 pm until 4:00 pm. You do not need an appointment for the interview!  °[facility] [address], [place],  336-631-2330   °[facility]  336-342-8273   °[facility]  336-703-3100   °[facility]  336-570-6415   ° °Behavioral Health Resources in the Community: °Intensive Outpatient Programs °Organization         Address  Phone  Notes  °[facility] [address], [place], [place] 336-878-6098   °[facility] [address], [place], [place] 336-832-9800   °[facility] [address], [place], [place] ° 336-882-2125   °[facility] [address],  °[place], [place] 1-800-853-5163 or 336-641-4981   °Substance Abuse Resources °Organization         Address  Phone  Notes  °[facility]  336-882-2125   °[facility]  336-784-9470   °[facility]  336-285-9073   °[facility]  336-845-3988   °Residential & Outpatient Substance Abuse Program  1-800-659-3381   °Psychological Services °Organization         Address  Phone  Notes  °[facility]  336- 832-9600   °[facility]  336- 378-7881   °[facility] [address], [place] 1-800-853-5163 or 336-641-4981   ° °Mobile Crisis Teams °Organization         Address  Phone  Notes  °[facility], Mobile Crisis Care Unit  1-877-626-1772   °Assertive °[facility] ° [address] [place], [place] 336-834-9664   °Sharon DeEsch [address] °[place] 336-554-5454   ° °Self-Help/Support  Groups °Organization         Address  Phone             Notes  °[facility] - variety of support groups  336- 373-1402 Call for more information  °Narcotics Anonymous (NA), [facility] [address], °[place]  2 meetings at this location  ° °Residential Treatment Programs °Organization         Address  Phone  Notes  °[facility] [address],    °[place]  1-866-801-8205   °[facility] ° [address], [place], [place] 704-293-8524   °[facility] [address], [place] 336-845-3988 Admissions: 8am-3pm M-F  °[facility] [address]    °[place], [place] 336-841-1104   °[facility] [address], [place], [place] 336-379-7146   °[facility] [address]  °[place], [place] 336-285-9073   °[facility] Intensive Outpatient [address], [place], [place] 336-852-3033   °[facility] [address]  °[place], [place] 1-877-615-2722 or 336-784-9470   °[facility] [address] [place], [place] 336-227-7417 Accepts Medicaid  °[facility] [address]  ° [place] 1-800-659-3381 Substance Abuse/Addiction Treatment  ° °[place] Behavioral Health Resources °Organization         Address  Phone  Notes  °[facility]  (888) 581-9988   °Julie Brannon, PhD [address] [place], [place]   (336) 349-5553 or (336) 951-0000   ° Behavioral   [address] °[place], [place] (336) 349-4454   °[facility] [address], [place], [place] (336) 342-8316 Insurance/Medicaid/sponsorship through [facility]  °[facility] [address]                                    ,  (336) 342-8316 Therapy/tele-psych/case  °[facility]   [address]  ° [place], [place] (336) 349-2233    °Dr. Arfeen  (336) 349-4544   °[facility] [place]  [facility]  [facility] [place] [facility] 1) [address], [place] °2) [address], [place] °3)  [address], [place] (336) 349-3220 °(336) 342-7768 ° °(336) 342-8140   °[place] [facility] (336) 342-1394 or (336) 342-3537 (After Hours)    ° ° ° °

## 2014-08-19 NOTE — ED Notes (Signed)
Pt. Stated, I have a abscess on the upper tooth on the right.  I've had them before so i know whats its like.

## 2014-08-19 NOTE — ED Provider Notes (Signed)
CSN: [id number]     Arrival date & time 08/19/14  1703 History  This chart was scribed for a non-physician practitioner, Conard Novak NP-C, working with Hurman Horn, MD by [place], ED Scribe. The patient was seen in [facility]. The patient's care was started at 5:41 PM.    Chief Complaint  Patient presents with  . Dental Pain      Patient is a 38 y.o. male presenting with tooth pain. The history is provided by the patient. No language interpreter was used.  Dental Pain Location:  Upper Quality:  Throbbing Severity:  Severe Chronicity:  Recurrent Context: abscess and dental fracture   Relieved by:  Nothing Worsened by:  Jaw movement and hot food/drink Associated symptoms: facial pain and gum swelling   Associated symptoms: no difficulty swallowing and no fever   HPI Comments: [name] Ramsey is a 38 y.o. male who presents to the Emergency Department complaining of severe dental pain to the right upper aspect of his mouth that is giving him extreme discomfort with associated swelling. Pt reports difficulty eating and experiencing some radiating pain below his eye. He further reports that he has been having trouble sleeping. Pt goes on to state that he has had similar issues in the past. Pt denies any difficulty swallowing. He states that he has been taking Tylenol and IBU without relief. Pt is current smoker but reports he is trying to quit.  Past Medical History  Diagnosis Date  . Gastric ulcer    Past Surgical History  Procedure Laterality Date  . Fracture surgery    . Cholecystectomy    . Ulcers     No family history on file. History  Substance Use Topics  . Smoking status: Current Every Day Smoker  . Smokeless tobacco: Not on file  . Alcohol Use: Yes    Review of Systems  Constitutional: Negative for fever and chills.       Not sleeping well.   HENT: Positive for dental problem. Negative for trouble swallowing.        Radiating facial pain.   All other  systems reviewed and are negative.     Allergies  Aspirin  Home Medications   Prior to Admission medications   Medication Sig Start Date End Date Taking? Authorizing Provider  HYDROcodone-acetaminophen (NORCO) 5-325 MG per tablet Take 1 tablet by mouth every 4 (four) hours as needed. 03/29/14   Audree Camel, MD  ibuprofen (ADVIL,MOTRIN) 200 MG tablet Take 200 mg by mouth every 6 (six) hours as needed for moderate pain.    Historical Provider, MD   BP 109/61  Pulse 79  Temp(Src) 98.2 F (36.8 C) (Oral)  Resp 17  Ht 5\' 6"  (1.676 m)  Wt 200 lb (90.719 kg)  BMI 32.30 kg/m2  SpO2 98% Physical Exam  Nursing note and vitals reviewed. Constitutional: He is oriented to person, place, and time. He appears well-developed and well-nourished. No distress.  HENT:  Head: Normocephalic and atraumatic.  Mouth/Throat:    Eyes: Conjunctivae and EOM are normal.  Neck: Neck supple. No tracheal deviation present.  Cardiovascular: Normal rate.   Pulmonary/Chest: Effort normal. No respiratory distress.  Musculoskeletal: Normal range of motion.  Neurological: He is alert and oriented to person, place, and time.  Skin: Skin is warm and dry.  Psychiatric: He has a normal mood and affect. His behavior is normal.      ED Course  Procedures (including critical care time) Labs Review Labs Reviewed -  No data to display  Results for orders placed during the hospital encounter of 03/29/14  CBC      Result Value Ref Range   WBC 11.4 (*) 4.0 - 10.5 K/uL   RBC 5.19  4.22 - 5.81 MIL/uL   Hemoglobin 16.6  13.0 - 17.0 g/dL   HCT 54.047.6  98.139.0 - 19.152.0 %   MCV 91.7  78.0 - 100.0 fL   MCH 32.0  26.0 - 34.0 pg   MCHC 34.9  30.0 - 36.0 g/dL   RDW 47.813.5  29.511.5 - 62.115.5 %   Platelets 295  150 - 400 K/uL  BASIC METABOLIC PANEL      Result Value Ref Range   Sodium 141  137 - 147 mEq/L   Potassium 4.1  3.7 - 5.3 mEq/L   Chloride 104  96 - 112 mEq/L   CO2 27  19 - 32 mEq/L   Glucose, Bld 76  70 - 99 mg/dL    BUN 15  6 - 23 mg/dL   Creatinine, Ser 3.080.66  0.50 - 1.35 mg/dL   Calcium 9.2  8.4 - 65.710.5 mg/dL   GFR calc non Af Amer >90  >90 mL/min   GFR calc Af Amer >90  >90 mL/min   No results found.    Imaging Review No results found.   EKG Interpretation None     Medications - No data to display  5:46 PM- Treatment plan was discussed with patient who verbalizes understanding and agrees.   MDM   Final diagnoses:  None    Dental pain.  Widespread tooth decay.  Antibiotic, analgesia, anti-inflammatory.  Dental follow up.  Dental resource guide provided.  I personally performed the services described in this documentation, which was scribed in my presence. The recorded information has been reviewed and is accurate.    Jimmye Normanavid John Beauregard Jarrells, NP 08/20/14 [phone]

## 2014-08-30 NOTE — ED Provider Notes (Signed)
Medical screening examination/treatment/procedure(s) were performed by non-physician practitioner and as supervising physician I was immediately available for consultation/collaboration.   EKG Interpretation None       Hurman Horn, MD 08/30/14 [phone]

## 2015-03-21 ENCOUNTER — Ambulatory Visit: Payer: Self-pay

## 2015-03-23 ENCOUNTER — Ambulatory Visit (INDEPENDENT_AMBULATORY_CARE_PROVIDER_SITE_OTHER): Payer: No Typology Code available for payment source | Admitting: Emergency Medicine

## 2015-03-23 VITALS — BP 124/60 | HR 62 | Temp 98.0°F | Resp 18 | Ht 64.5 in | Wt 176.8 lb

## 2015-03-23 DIAGNOSIS — S335XXA Sprain of ligaments of lumbar spine, initial encounter: Secondary | ICD-10-CM | POA: Diagnosis not present

## 2015-03-23 MED ORDER — HYDROCODONE-ACETAMINOPHEN 5-325 MG PO TABS: 1.0000 | ORAL_TABLET | ORAL | Status: DC | PRN

## 2015-03-23 MED ORDER — NAPROXEN SODIUM 550 MG PO TABS: 550.0000 mg | ORAL_TABLET | Freq: Two times a day (BID) | ORAL | Status: AC

## 2015-03-23 MED ORDER — CYCLOBENZAPRINE HCL 10 MG PO TABS: 10.0000 mg | ORAL_TABLET | Freq: Three times a day (TID) | ORAL | Status: DC | PRN

## 2015-03-23 NOTE — Progress Notes (Signed)
[facility] [address], [place] [id number] [phone]  Date:  03/23/2015   Name:  [name] Ramsey   DOB:  04/22/1976   MRN:  [id number]  PCP:  Default, Provider, MD    Chief Complaint: Muscle Pain and Tingling   History of Present Illness:  [name] Ramsey is a 39 y.o. very pleasant male patient who presents with the following:  Injured lower back while moving furniture.  Says lifting a box with another who dropped his end Has pain in lower back.  nonradiating No neuro symptoms Has some paresthesias in right forearm started last night and awakened him from sleep. No improvement with over the counter medications or other home remedies. Denies other complaint or health concern today.   There are no active problems to display for this patient.   Past Medical History  Diagnosis Date  . Gastric ulcer     Past Surgical History  Procedure Laterality Date  . Fracture surgery    . Cholecystectomy    . Ulcers      History  Substance Use Topics  . Smoking status: Current Every Day Smoker  . Smokeless tobacco: Not on file  . Alcohol Use: Yes    History reviewed. No pertinent family history.  Allergies  Allergen Reactions  . Aspirin Other (See Comments)    Irritates ulcers.     Medication list has been reviewed and updated.  No current outpatient prescriptions on file prior to visit.   No current facility-administered medications on file prior to visit.    Review of Systems:  As per HPI, otherwise negative.    Physical Examination: Filed Vitals:   03/23/15 1405  BP: 124/60  Pulse: 62  Temp: 98 F (36.7 C)  Resp: 18   Filed Vitals:   03/23/15 1405  Height: 5' 4.5" (1.638 m)  Weight: 176 lb 12.8 oz (80.196 kg)   Body mass index is 29.89 kg/(m^2). Ideal Body Weight: Weight in (lb) to have BMI = 25: 147.6  GEN: WDWN, NAD, Non-toxic, A & O x 3 HEENT: Atraumatic, Normocephalic. Neck supple. No masses, No LAD. Ears and Nose:  No external deformity. CV: RRR, No M/G/R. No JVD. No thrill. No extra heart sounds. PULM: CTA B, no wheezes, crackles, rhonchi. No retractions. No resp. distress. No accessory muscle use. ABD: S, NT, ND, +BS. No rebound. No HSM. EXTR: No c/c/e NEURO Normal gait.  PSYCH: Normally interactive. Conversant. Not depressed or anxious appearing.  Calm demeanor.  BACK:  Tender bilateral lumbar paraspinous muscles.  No tenderness over spine.  Gross motor or sensory intact.  Assessment and Plan: Acute lumbar strain Anaprox Flexeril  norco  Signed,  Phillips OdorJeffery Cathi Hazan, MD

## 2015-03-23 NOTE — Patient Instructions (Signed)

## 2015-04-24 ENCOUNTER — Emergency Department (HOSPITAL_COMMUNITY)
Admission: EM | Admit: 2015-04-24 | Discharge: 2015-04-24 | Disposition: A | Discharge status: Home / Self Care | Payer: Self-pay | Attending: Emergency Medicine | Admitting: Emergency Medicine

## 2015-04-24 ENCOUNTER — Encounter (HOSPITAL_COMMUNITY): Payer: Self-pay | Admitting: *Deleted

## 2015-04-24 ENCOUNTER — Emergency Department (HOSPITAL_COMMUNITY): Payer: Self-pay

## 2015-04-24 DIAGNOSIS — R51 Headache: Secondary | ICD-10-CM

## 2015-04-24 DIAGNOSIS — Y9389 Activity, other specified: Secondary | ICD-10-CM | POA: Insufficient documentation

## 2015-04-24 DIAGNOSIS — Z720 Tobacco use: Secondary | ICD-10-CM | POA: Insufficient documentation

## 2015-04-24 DIAGNOSIS — Y998 Other external cause status: Secondary | ICD-10-CM | POA: Insufficient documentation

## 2015-04-24 DIAGNOSIS — S060X0A Concussion without loss of consciousness, initial encounter: Secondary | ICD-10-CM | POA: Insufficient documentation

## 2015-04-24 DIAGNOSIS — R519 Headache, unspecified: Secondary | ICD-10-CM

## 2015-04-24 DIAGNOSIS — W228XXA Striking against or struck by other objects, initial encounter: Secondary | ICD-10-CM | POA: Insufficient documentation

## 2015-04-24 DIAGNOSIS — S0990XA Unspecified injury of head, initial encounter: Secondary | ICD-10-CM

## 2015-04-24 DIAGNOSIS — Z8719 Personal history of other diseases of the digestive system: Secondary | ICD-10-CM | POA: Insufficient documentation

## 2015-04-24 DIAGNOSIS — Y929 Unspecified place or not applicable: Secondary | ICD-10-CM | POA: Insufficient documentation

## 2015-04-24 MED ORDER — OXYCODONE-ACETAMINOPHEN 5-325 MG PO TABS
ORAL_TABLET | ORAL | Status: AC
  Filled 2015-04-24: qty 1

## 2015-04-24 MED ORDER — KETOROLAC TROMETHAMINE 60 MG/2ML IM SOLN
60.0000 mg | Freq: Once | INTRAMUSCULAR | Status: AC
  Administered 2015-04-24: 60 mg via INTRAMUSCULAR
  Filled 2015-04-24: qty 2

## 2015-04-24 MED ORDER — OXYCODONE-ACETAMINOPHEN 5-325 MG PO TABS
1.0000 | ORAL_TABLET | Freq: Once | ORAL | Status: AC
  Administered 2015-04-24: 1 via ORAL

## 2015-04-24 MED ORDER — PROCHLORPERAZINE EDISYLATE 5 MG/ML IJ SOLN
10.0000 mg | Freq: Once | INTRAMUSCULAR | Status: AC
  Administered 2015-04-24: 10 mg via INTRAMUSCULAR
  Filled 2015-04-24: qty 2

## 2015-04-24 MED ORDER — BUTALBITAL-APAP-CAFFEINE 50-325-40 MG PO TABS: 1.0000 | ORAL_TABLET | Freq: Four times a day (QID) | ORAL | Status: AC | PRN

## 2015-04-24 NOTE — ED Notes (Signed)
PA at bedside.

## 2015-04-24 NOTE — ED Notes (Signed)
Pt reports being startled and sat up quickly, hit back of head really hard on shelf. Denies loc. Now having severe headache, nausea, sensitivity to light.

## 2015-04-24 NOTE — Discharge Instructions (Signed)
Take the prescribed medication as directed. See attached documents regarding concussions and supportive measures. Return to the ED for new or worsening symptoms-- numbness, weakness, confusion, changes in speech, vision changes, etc.

## 2015-04-24 NOTE — ED Provider Notes (Signed)
CSN: [id number]     Arrival date & time 04/24/15  1721 History  This chart was scribed for non-physician practitioner, Sharilyn SitesLisa Vasilia Dise, PA-C working with Mancel BaleElliott Wentz, MD, by Jarvis Morganaylor Ferguson, ED Scribe. This patient was seen in room TR10C/TR10C and the patient's care was started at 8:02 PM.    Chief Complaint  Patient presents with  . Head Injury    The history is provided by the patient. No language interpreter was used.    HPI Comments: [name] Ramsey is a 39 y.o. male with a h/o gastric ulcer who presents to the Emergency Department complaining of a head injury that occurred PTA. Pt states he was lying down and was startled and sat up really quickly and hit the back of his head really hard on a metal shelf. Pt states everything went dark for a minute after he hit his head. He is having an associated moderate, "pounding", HA along with nausea, dizziness, and photophobia. He denies any LOC. Pt took some BC powders at work today with no  Pt reports he took a Percocet while in the ED which provided some mild relief. He denies any visual disturbance, confusion, tinnitus, numbness, weakness, ataxia.  Patient not currently on anti-coagulants.  VSS.   Past Medical History  Diagnosis Date  . Gastric ulcer    Past Surgical History  Procedure Laterality Date  . Fracture surgery    . Cholecystectomy    . Ulcers     History reviewed. No pertinent family history. History  Substance Use Topics  . Smoking status: Current Every Day Smoker  . Smokeless tobacco: Not on file  . Alcohol Use: Yes    Review of Systems  Eyes: Positive for photophobia. Negative for visual disturbance.  Gastrointestinal: Positive for nausea.  Neurological: Positive for dizziness and headaches.  All other systems reviewed and are negative.     Allergies  Aspirin  Home Medications   Prior to Admission medications   Medication Sig Start Date End Date Taking? Authorizing Provider  Aspirin-Salicylamide-Caffeine  (BC HEADACHE PO) Take 1 tablet by mouth daily as needed. For headache   Yes Historical Provider, MD  cyclobenzaprine (FLEXERIL) 10 MG tablet Take 1 tablet (10 mg total) by mouth 3 (three) times daily as needed for muscle spasms. Patient not taking: Reported on 04/24/2015 03/23/15   Carmelina DaneJeffery S Anderson, MD  HYDROcodone-acetaminophen [facility](NORCO) 5-325 MG per tablet Take 1-2 tablets by mouth every 4 (four) hours as needed. Patient not taking: Reported on 04/24/2015 03/23/15   Carmelina DaneJeffery S Anderson, MD  naproxen sodium (ANAPROX DS) 550 MG tablet Take 1 tablet (550 mg total) by mouth 2 (two) times daily with a meal. Patient not taking: Reported on 04/24/2015 03/23/15 03/22/16  Carmelina DaneJeffery S Anderson, MD   Triage Vitals: BP 140/84 mmHg  Pulse 71  Temp(Src) 98.1 F (36.7 C) (Oral)  Resp 18  Ht 5\' 5"  (1.651 m)  Wt 183 lb 4.8 oz (83.144 kg)  BMI 30.50 kg/m2  SpO2 98%  Physical Exam  Constitutional: He is oriented to person, place, and time. He appears well-developed and well-nourished. No distress.  Talking on cell phone with family members  HENT:  Head: Normocephalic and atraumatic.  Mouth/Throat: Oropharynx is clear and moist.  Eyes: Conjunctivae and EOM are normal. Pupils are equal, round, and reactive to light.  Neck: Normal range of motion.  Cardiovascular: Normal rate, regular rhythm and normal heart sounds.   Pulmonary/Chest: Effort normal and breath sounds normal.  Abdominal: Soft. Bowel sounds are  normal.  Musculoskeletal: Normal range of motion.  Neurological: He is alert and oriented to person, place, and time.  AAOx3, answering questions appropriately; equal strength UE and LE bilaterally; CN grossly intact; moves all extremities appropriately without ataxia; no focal neuro deficits or facial asymmetry appreciated  Skin: Skin is warm and dry. He is not diaphoretic.  Psychiatric: He has a normal mood and affect.  Nursing note and vitals reviewed.   ED Course  Procedures (including critical care  time)  DIAGNOSTIC STUDIES: Oxygen Saturation is 98% on RA, normal by my interpretation.    COORDINATION OF CARE: 6:05 PM- Will order Head CT w/o contrast along with Percocet.  Pt advised of plan for treatment and pt agrees.  8:18 PM- Will order Toradol and Compazine injections.  Pt advised of plan for treatment and pt agrees.   Labs Review Labs Reviewed - No data to display  Imaging Review Ct Head Wo Contrast  04/24/2015   CLINICAL DATA:  Hit back of head on metal self today, now with severe headache nausea and sensitivity to light. No loss of consciousness.  EXAM: CT HEAD WITHOUT CONTRAST  TECHNIQUE: Contiguous axial images were obtained from the base of the skull through the vertex without intravenous contrast.  COMPARISON:  None.  FINDINGS: Gray-white differentiation is maintained. No CT evidence acute large territory infarct. No intraparenchymal or extra-axial mass or hemorrhage. Normal size and configuration of the ventricles and basilar cisterns. No midline shift. Polypoid mucosal thickening of the right maxillary sinus. The remaining paranasal sinuses and mastoid air cells are normally aerated. No air-fluid levels. Regional soft tissues appear normal. No displaced calvarial fracture. No radiopaque foreign body.  IMPRESSION: 1. Negative noncontrast head CT. 2. Polypoid mucosal thickening of the right maxillary sinus. No air-fluid levels.   Electronically Signed   By: Simonne Come M.D.   On: 04/24/2015 19:29     EKG Interpretation None      MDM   Final diagnoses:  Head injury, initial encounter  Headache, unspecified headache type  Concussion, without loss of consciousness, initial encounter   39 year old male with head injury prior to arrival. He now reports throbbing headache, nausea, photophobia, and intermittent dizziness. On exam, patient is neurologically intact. He denies any neck pain. He was given Percocet in triage was some improvement of his headache. CT head obtained in  triage as well, negative for acute intracranial injuries.  He was additionally given Toradol and Compazine with significant improvement of his headache. His neurologic exam remains nonfocal.  Patient with likely concussion.  This was discussed with patient, given supportive measures and encouraged brain rest (ie-- avoid reading, texting, computer work, television, etc).  He cannot take NSAIDs nor ASA as he has stomach ulcers, short supply of fioricet given for home for recurrent headaches.  He was given hand outs with instructions for continued concussion care at home as well as warning signs that would warrant immediate return to ED.  Discussed plan with patient, he/she acknowledged understanding and agreed with plan of care.  Return precautions given for new or worsening symptoms.  I personally performed the services described in this documentation, which was scribed in my presence. The recorded information has been reviewed and is accurate.   Garlon Hatchet, PA-C 04/24/15 2202  Mancel Bale, MD 04/25/15 2350

## 2015-06-22 ENCOUNTER — Emergency Department (HOSPITAL_COMMUNITY)
Admission: EM | Admit: 2015-06-22 | Discharge: 2015-06-22 | Disposition: A | Discharge status: Home / Self Care | Payer: No Typology Code available for payment source | Attending: Emergency Medicine | Admitting: Emergency Medicine

## 2015-06-22 ENCOUNTER — Emergency Department (HOSPITAL_COMMUNITY): Payer: No Typology Code available for payment source

## 2015-06-22 ENCOUNTER — Encounter (HOSPITAL_COMMUNITY): Payer: Self-pay | Admitting: Emergency Medicine

## 2015-06-22 DIAGNOSIS — W01198A Fall on same level from slipping, tripping and stumbling with subsequent striking against other object, initial encounter: Secondary | ICD-10-CM | POA: Insufficient documentation

## 2015-06-22 DIAGNOSIS — Z8719 Personal history of other diseases of the digestive system: Secondary | ICD-10-CM | POA: Insufficient documentation

## 2015-06-22 DIAGNOSIS — R319 Hematuria, unspecified: Secondary | ICD-10-CM | POA: Diagnosis not present

## 2015-06-22 DIAGNOSIS — R1030 Lower abdominal pain, unspecified: Secondary | ICD-10-CM

## 2015-06-22 DIAGNOSIS — S29001A Unspecified injury of muscle and tendon of front wall of thorax, initial encounter: Secondary | ICD-10-CM | POA: Diagnosis not present

## 2015-06-22 DIAGNOSIS — S3992XA Unspecified injury of lower back, initial encounter: Secondary | ICD-10-CM | POA: Insufficient documentation

## 2015-06-22 DIAGNOSIS — Z720 Tobacco use: Secondary | ICD-10-CM | POA: Insufficient documentation

## 2015-06-22 DIAGNOSIS — S3991XA Unspecified injury of abdomen, initial encounter: Secondary | ICD-10-CM | POA: Diagnosis not present

## 2015-06-22 DIAGNOSIS — M545 Low back pain: Secondary | ICD-10-CM

## 2015-06-22 DIAGNOSIS — Y9289 Other specified places as the place of occurrence of the external cause: Secondary | ICD-10-CM | POA: Diagnosis not present

## 2015-06-22 DIAGNOSIS — W19XXXA Unspecified fall, initial encounter: Secondary | ICD-10-CM

## 2015-06-22 DIAGNOSIS — Y998 Other external cause status: Secondary | ICD-10-CM | POA: Insufficient documentation

## 2015-06-22 DIAGNOSIS — Z79899 Other long term (current) drug therapy: Secondary | ICD-10-CM | POA: Insufficient documentation

## 2015-06-22 DIAGNOSIS — Y9389 Activity, other specified: Secondary | ICD-10-CM | POA: Insufficient documentation

## 2015-06-22 DIAGNOSIS — Z7982 Long term (current) use of aspirin: Secondary | ICD-10-CM | POA: Diagnosis not present

## 2015-06-22 LAB — I-STAT CHEM 8, ED
BUN: 17 mg/dL (ref 6–20)
CHLORIDE: 104 mmol/L (ref 101–111)
CREATININE: 0.7 mg/dL (ref 0.61–1.24)
Calcium, Ion: 1.21 mmol/L (ref 1.12–1.23)
Glucose, Bld: 80 mg/dL (ref 65–99)
HCT: 50 % (ref 39.0–52.0)
Hemoglobin: 17 g/dL (ref 13.0–17.0)
Potassium: 4.8 mmol/L (ref 3.5–5.1)
SODIUM: 140 mmol/L (ref 135–145)
TCO2: 27 mmol/L (ref 0–100)

## 2015-06-22 LAB — CBC WITH DIFFERENTIAL/PLATELET
BASOS ABS: 0.1 10*3/uL (ref 0.0–0.1)
Basophils Relative: 1 % (ref 0–1)
EOS PCT: 1 % (ref 0–5)
Eosinophils Absolute: 0.1 10*3/uL (ref 0.0–0.7)
HEMATOCRIT: 48.1 % (ref 39.0–52.0)
HEMOGLOBIN: 16.6 g/dL (ref 13.0–17.0)
LYMPHS PCT: 17 % (ref 12–46)
Lymphs Abs: 1.9 10*3/uL (ref 0.7–4.0)
MCH: 31.1 pg (ref 26.0–34.0)
MCHC: 34.5 g/dL (ref 30.0–36.0)
MCV: 90.2 fL (ref 78.0–100.0)
MONO ABS: 0.8 10*3/uL (ref 0.1–1.0)
MONOS PCT: 8 % (ref 3–12)
Neutro Abs: 8.3 10*3/uL — ABNORMAL HIGH (ref 1.7–7.7)
Neutrophils Relative %: 73 % (ref 43–77)
Platelets: 305 10*3/uL (ref 150–400)
RBC: 5.33 MIL/uL (ref 4.22–5.81)
RDW: 13.9 % (ref 11.5–15.5)
WBC: 11.2 10*3/uL — ABNORMAL HIGH (ref 4.0–10.5)

## 2015-06-22 LAB — APTT: aPTT: 27 seconds (ref 24–37)

## 2015-06-22 LAB — URINALYSIS, ROUTINE W REFLEX MICROSCOPIC
BILIRUBIN URINE: NEGATIVE
GLUCOSE, UA: NEGATIVE mg/dL
Hgb urine dipstick: NEGATIVE
Ketones, ur: NEGATIVE mg/dL
Leukocytes, UA: NEGATIVE
NITRITE: NEGATIVE
PH: 8 (ref 5.0–8.0)
Protein, ur: NEGATIVE mg/dL
Specific Gravity, Urine: 1.013 (ref 1.005–1.030)
Urobilinogen, UA: 0.2 mg/dL (ref 0.0–1.0)

## 2015-06-22 LAB — PROTIME-INR
INR: 0.91 (ref 0.00–1.49)
Prothrombin Time: 12.5 seconds (ref 11.6–15.2)

## 2015-06-22 MED ORDER — HYDROCODONE-ACETAMINOPHEN 5-325 MG PO TABS: 1.0000 | ORAL_TABLET | ORAL | Status: DC | PRN

## 2015-06-22 MED ORDER — METHOCARBAMOL 750 MG PO TABS: 750.0000 mg | ORAL_TABLET | Freq: Four times a day (QID) | ORAL | Status: DC | PRN

## 2015-06-22 MED ORDER — OXYCODONE-ACETAMINOPHEN 5-325 MG PO TABS
1.0000 | ORAL_TABLET | Freq: Once | ORAL | Status: AC
  Administered 2015-06-22: 1 via ORAL
  Filled 2015-06-22: qty 1

## 2015-06-22 MED ORDER — ONDANSETRON HCL 4 MG/2ML IJ SOLN
4.0000 mg | Freq: Once | INTRAMUSCULAR | Status: AC
  Administered 2015-06-22: 4 mg via INTRAVENOUS
  Filled 2015-06-22: qty 2

## 2015-06-22 MED ORDER — IOHEXOL 300 MG/ML  SOLN
100.0000 mL | Freq: Once | INTRAMUSCULAR | Status: AC | PRN
  Administered 2015-06-22: 100 mL via INTRAVENOUS

## 2015-06-22 MED ORDER — SODIUM CHLORIDE 0.9 % IV BOLUS (SEPSIS)
1000.0000 mL | Freq: Once | INTRAVENOUS | Status: AC
  Administered 2015-06-22: 1000 mL via INTRAVENOUS

## 2015-06-22 MED ORDER — HYDROMORPHONE HCL 1 MG/ML IJ SOLN
1.0000 mg | Freq: Once | INTRAMUSCULAR | Status: AC
  Administered 2015-06-22: 1 mg via INTRAVENOUS
  Filled 2015-06-22: qty 1

## 2015-06-22 MED ORDER — FENTANYL CITRATE (PF) 100 MCG/2ML IJ SOLN
50.0000 ug | Freq: Once | INTRAMUSCULAR | Status: DC
  Filled 2015-06-22: qty 2

## 2015-06-22 MED ORDER — METHOCARBAMOL 500 MG PO TABS
1000.0000 mg | ORAL_TABLET | Freq: Once | ORAL | Status: AC
  Administered 2015-06-22: 1000 mg via ORAL
  Filled 2015-06-22: qty 2

## 2015-06-22 NOTE — ED Notes (Signed)
Pt returned from CT °

## 2015-06-22 NOTE — ED Notes (Signed)
Pt drinking fluids at this time

## 2015-06-22 NOTE — ED Notes (Signed)
Pt st's no relief from pain meds. 

## 2015-06-22 NOTE — Discharge Instructions (Signed)
Read the information below.  Use the prescribed medication as directed.  Please discuss all new medications with your pharmacist.  Do not take additional tylenol while taking the prescribed pain medication to avoid overdose.  You may return to the Emergency Department at any time for worsening condition or any new symptoms that concern you.   If you develop fevers, loss of control of bowel or bladder, weakness or numbness in your legs, or are unable to walk, return to the ER for a recheck.    Back Pain, Adult Low back pain is very common. About 1 in 5 people have back pain.The cause of low back pain is rarely dangerous. The pain often gets better over time.About half of people with a sudden onset of back pain feel better in just 2 weeks. About 8 in 10 people feel better by 6 weeks.  CAUSES Some common causes of back pain include:  Strain of the muscles or ligaments supporting the spine.  Wear and tear (degeneration) of the spinal discs.  Arthritis.  Direct injury to the back. DIAGNOSIS Most of the time, the direct cause of low back pain is not known.However, back pain can be treated effectively even when the exact cause of the pain is unknown.Answering your caregiver's questions about your overall health and symptoms is one of the most accurate ways to make sure the cause of your pain is not dangerous. If your caregiver needs more information, he or she may order lab work or imaging tests (X-rays or MRIs).However, even if imaging tests show changes in your back, this usually does not require surgery. HOME CARE INSTRUCTIONS For many people, back pain returns.Since low back pain is rarely dangerous, it is often a condition that people can learn to [facility] their own.   Remain active. It is stressful on the back to sit or stand in one place. Do not sit, drive, or stand in one place for more than 30 minutes at a time. Take short walks on level surfaces as soon as pain allows.Try to increase  the length of time you walk each day.  Do not stay in bed.Resting more than 1 or 2 days can delay your recovery.  Do not avoid exercise or work.Your body is made to move.It is not dangerous to be active, even though your back may hurt.Your back will likely heal faster if you return to being active before your pain is gone.  Pay attention to your body when you bend and lift. Many people have less discomfortwhen lifting if they bend their knees, keep the load close to their bodies,and avoid twisting. Often, the most comfortable positions are those that put less stress on your recovering back.  Find a comfortable position to sleep. Use a firm mattress and lie on your side with your knees slightly bent. If you lie on your back, put a pillow under your knees.  Only take over-the-counter or prescription medicines as directed by your caregiver. Over-the-counter medicines to reduce pain and inflammation are often the most helpful.Your caregiver may prescribe muscle relaxant drugs.These medicines help dull your pain so you can more quickly return to your normal activities and healthy exercise.  Put ice on the injured area.  Put ice in a plastic bag.  Place a towel between your skin and the bag.  Leave the ice on for 15-20 minutes, 03-04 times a day for the first 2 to 3 days. After that, ice and heat may be alternated to reduce pain and spasms.  Ask your caregiver about trying back exercises and gentle massage. This may be of some benefit.  Avoid feeling anxious or stressed.Stress increases muscle tension and can worsen back pain.It is important to recognize when you are anxious or stressed and learn ways to manage it.Exercise is a great option. SEEK MEDICAL CARE IF:  You have pain that is not relieved with rest or medicine.  You have pain that does not improve in 1 week.  You have new symptoms.  You are generally not feeling well. SEEK IMMEDIATE MEDICAL CARE IF:   You have pain  that radiates from your back into your legs.  You develop new bowel or bladder control problems.  You have unusual weakness or numbness in your arms or legs.  You develop nausea or vomiting.  You develop abdominal pain.  You feel faint. Document Released: 12/16/2005 Document Revised: 06/16/2012 Document Reviewed: 04/19/2014 [facility] Patient Information 2015 [place], [place]. This information is not intended to replace advice given to you by your health care provider. Make sure you discuss any questions you have with your health care provider.  Abdominal Pain Many things can cause abdominal pain. Usually, abdominal pain is not caused by a disease and will improve without treatment. It can often be observed and treated at home. Your health care provider will do a physical exam and possibly order blood tests and X-rays to help determine the seriousness of your pain. However, in many cases, more time must pass before a clear cause of the pain can be found. Before that point, your health care provider may not know if you need more testing or further treatment. HOME CARE INSTRUCTIONS  Monitor your abdominal pain for any changes. The following actions may help to alleviate any discomfort you are experiencing:  Only take over-the-counter or prescription medicines as directed by your health care provider.  Do not take laxatives unless directed to do so by your health care provider.  Try a clear liquid diet (broth, tea, or water) as directed by your health care provider. Slowly move to a bland diet as tolerated. SEEK MEDICAL CARE IF:  You have unexplained abdominal pain.  You have abdominal pain associated with nausea or diarrhea.  You have pain when you urinate or have a bowel movement.  You experience abdominal pain that wakes you in the night.  You have abdominal pain that is worsened or improved by eating food.  You have abdominal pain that is worsened with eating fatty foods.  You  have a fever. SEEK IMMEDIATE MEDICAL CARE IF:   Your pain does not go away within 2 hours.  You keep throwing up (vomiting).  Your pain is felt only in portions of the abdomen, such as the right side or the left lower portion of the abdomen.  You pass bloody or black tarry stools. MAKE SURE YOU:  Understand these instructions.   Will watch your condition.   Will get help right away if you are not doing well or get worse.  Document Released: 09/25/2005 Document Revised: 12/21/2013 Document Reviewed: 08/25/2013  [facility] Patient Information 2015 [place], [place]. This information is not intended to replace advice given to you by your health care provider. Make sure you discuss any questions you have with your health care provider.    Emergency Department Resource Guide 1) Find a Doctor and Pay Out of Pocket Although you won't have to find out who is covered by your insurance plan, it is a good idea to ask around and get recommendations.  You will then need to call the office and see if the doctor you have chosen will accept you as a new patient and what types of options they offer for patients who are self-pay. Some doctors offer discounts or will set up payment plans for their patients who do not have insurance, but you will need to ask so you aren't surprised when you get to your appointment.  2) Contact Your Local Health Department Not all health departments have doctors that can see patients for sick visits, but many do, so it is worth a call to see if yours does. If you don't know where your local health department is, you can check in your phone book. The CDC also has a tool to help you locate your state's health department, and many state websites also have listings of all of their local health departments.  3) Find a Walk-in Clinic If your illness is not likely to be very severe or complicated, you may want to try a walk in clinic. These are popping up all over the country in  pharmacies, drugstores, and shopping centers. They're usually staffed by nurse practitioners or physician assistants that have been trained to treat common illnesses and complaints. They're usually fairly quick and inexpensive. However, if you have serious medical issues or chronic medical problems, these are probably not your best option.  No Primary Care Doctor: - Call Health Connect at  [phone] - they can help you locate a primary care doctor that  accepts your insurance, provides certain services, etc. - Physician Referral Service- [phone]  Chronic Pain Problems: Organization         Address  Phone   Notes  [facility]  [phone] Patients need to be referred by their primary care doctor.   Medication Assistance: Organization         Address  Phone   Notes  [facility] Medication [facility] [address] [place] [place], [place] [address] [phone] --Must be a resident of [facility] -- Must have NO insurance coverage whatsoever (no Medicaid/ Medicare, etc.) -- The pt. MUST have a primary care doctor that directs their care regularly and follows them in the community   [facility]  [phone]   [employer]  [phone]    Agencies that provide inexpensive medical care: Organization         Address  Phone   Notes  [facility]  [phone]   [facility]    [phone]   [facility] [address] [place], [place] [address] [phone]   [facility] [address] [place] [place] [phone]   Planned Parenthood    [phone]   [facility]    [phone]   [facility]  [address], [place] Phone:  [phone], Fax:  [phone] Hours of Operation:  9 am - 6 pm, M-F.  Also accepts Medicaid/Medicare and self-pay.   [facility]  [address],  [place] Phone: [phone], Fax: [phone]. Hours of Operation:  8:30 am - 5:30 pm, M-F.  Also accepts Medicaid and self-pay.  [facility] [place] [address], [place] Phone: [phone]   [facility] [address] Natasha Bence [place], [place] [phone], Ext. [phone] Mondays & Thursdays: 7-9 AM.  First 15 patients are seen on a first come,  first serve basis.    Medicaid-accepting [facility] Providers:  Organization         Address  Phone   Notes  [facility] [address], [place] [phone] Also accepts self-pay patients.  [facility] [address] Laurell Josephs [place], [place]  [phone]   [facility] [address], [place] [phone]   [facility] [address] [place] [phone]   Renaye Rakers [address], [place]   [phone] Only accepts [place] patients after they have their name applied to their card.   Self-Pay (no insurance) in [facility]:  Organization         Address  Phone   Notes  Sickle Cell Patients, [facility] Internal Medicine [address] [place], [place] [phone]   [facility] [address] [place], [place] [phone]   [facility] [place]  [address], [place] [phone]   [facility]/Dr. Osei-Bonsu  [address], [place] or [address], [place] [phone] Phone number for both [place] and [place] locations is the same.  [facility] and [facility] [address] [place] [phone]   [facility] [address], [place] or [address] [phone]   [facility] [address] [place] [phone], phone; [phone], fax Sees patients 1st and 3rd Saturday of every month.  Must not  qualify for public or private insurance (i.e. Medicaid, Medicare, [place] Health Choice, Veterans' Benefits)  Household income should be no more than 200% of the poverty level The clinic cannot treat you if you are pregnant or think you are pregnant  Sexually transmitted diseases are not treated at the clinic.    Dental Care: Organization         Address  Phone  Notes  [facility] Department of [facility] [address] [place], [place] [phone] Accepts children up to age 107 who are enrolled in [place] or [place] Health Choice; pregnant women with a Medicaid card; and children who have applied for Medicaid or [place] Health Choice, but were declined, whose parents can pay a reduced fee at time of service.  [facility] Department of [facility]  [address], [place] [phone] Accepts children up to age 54 who are enrolled in [place] or [place] Health Choice; pregnant women with a Medicaid card; and children who have applied for Medicaid or [place] Health Choice, but were declined, whose parents can pay a reduced fee at time of service.  [facility]  [address] [place], [place] [phone] Patients are seen by appointment only. Walk-ins are not accepted. [facility] will see patients 38 years of age and older. Monday - Tuesday (8am-5pm) Most Wednesdays (8:30-5pm) $30 per visit, cash only  [facility] Adult [facility] Access [facility]  [address], [facility] [phone] Patients are seen by appointment only. Walk-ins are not accepted. [facility] will see patients 59 years of age and older. One Wednesday Evening (Monthly: Volunteer Based).  $30 per visit, cash only  [employer]  [phone] for adults; Children under age 101, call Graduate Pediatric Dentistry at [phone]. Children aged 42-14, please call (  [phone]) [phone] to request a pediatric application.  Dental services are provided  in all areas of dental care including fillings, crowns and bridges, complete and partial dentures, implants, gum treatment, root canals, and extractions. Preventive care is also provided. Treatment is provided to both adults and children. Patients are selected via a lottery and there is often a waiting list.   [facility] [address], [place]  [phone] [email or web address]   [facility] [address] [place], [place] [phone], Ext. [phone] Second and Fourth Thursday of each month, opens at 6:30 AM; Clinic ends at 9 AM.  Patients are seen on a first-come first-served basis, and a limited number are seen during each clinic.   [facility]  [address] Griff[address] [place], [place] [phone]   Eligibility Requirements You must have lived in [place], [place], or [place] counties for at least the last three months.   You cannot be eligible for state or federal sponsored [place], including [employer], [place], or [employer].   You generally cannot be eligible for healthcare insurance through your employer.    How to apply: Eligibility screenings are held every Tuesday and Wednesday afternoon from 1:00 pm until 4:00 pm. You do not need an appointment for the interview!  [facility] [address] [place], [place] [phone]   [facility] Health Department  [phone]   [facility] Health Department  [phone]   [facility] Health [facility]  [phone]    Behavioral Health Resources in the Community: Intensive Outpatient Programs Organization         Address  Phone  Notes  [facility] Services [address], [place], [place] [phone]   [facility] Outpatient [address] [place], [place] [phone]   [facility] [address] [place], [place]  [phone]   [facility] [address]  [place], [place]  [phone] or [phone]   Substance Abuse Resources Organization         Address  Phone  Notes  [facility]  [phone]   [facility]  5616507241   [facility] [place]  [phone]   Floydene Flock  [phone]   Residential & Outpatient Substance Abuse Program  [phone]   Psychological Services Organization         Address  Phone  Notes  [facility]  [phone]   [facility] Services  [phone]   [facility] [address], [place] [phone] or [phone]    Mobile Crisis Teams Organization         Address  Phone  Notes  [facility], Mobile Crisis Care Unit  [phone]   [facility]  [address] [place], [place] [phone]   Doristine Locks [address] [place] [phone]    Self-Help/Support Groups Organization         Address  Phone             Notes  [facility] [place] - variety of support groups  336- I7437963 Call for more information  Narcotics Anonymous (NA), [facility] [address], [employer] Agenda  2 meetings at this location   Statistician         Address  Phone  Notes  [facility] [address],    [place]  [phone]   [facility]  [id number], [place], [place] [phone]   [facility] [address] [place], [place] [phone] Admissions: 8am-3pm M-F  [facility] [address],    [place], [place] [phone]   [facility] [address] [place], [place], [place] [phone]   [facility] [address]  [place], [place] [phone]   [facility] - Intensive Outpatient [address]ure[address] Joseph[address], [place], [place] [phone]   [facility] ([facility]iction [facility] [address] [place]  [place], [place] [phone] or  [phone]   [facility] ([facility]) [address], [place], [place] [phone] Accepts Medicaid  [facility] [address]  [place] [phone] Substance Abuse/Addiction Treatment   [facility] Organization         Address  Phone  Notes  [facility]  8476424043   Angie Fava, PhD [address] Ervin Knack [place], [place]   5103272848 or [phone]   [facility] Behavioral   [address] [place], [place] [phone]   [facility] [address], [place], [place] [phone] Insurance/Medicaid/sponsorship through [facility] and Families [address]                                    [place], [place] [phone] Therapy/tele-psych/case  [facility] [address], [place] [phone]    Dr. Lolly Mustache  [phone]   [facility]  [employer] [facility] Dept. 1) [address] [place] 2) [address] [place] 3)  [address], [place] [phone]  [phone]   [facility] Child Abuse Hotline 551-713-4842 or 671-289-9967 (After Hours)

## 2015-06-22 NOTE — ED Notes (Addendum)
Pt states he was bailing hay yesterday and fell off the back of the truck (approx 4 feet). Now c/o LBP, groin pain, "can't get my breath because of the pain". Lungs with bilateral BS present and clear. Pt ambulated without difficulty.

## 2015-06-22 NOTE — ED Provider Notes (Signed)
CSN: [id number]     Arrival date & time 06/22/15  1330 History  This chart was scribed for non-physician practitioner, Trixie Dredge, PA-C, working with Mancel Bale, MD, by Budd Palmer ED Scribe. This patient was seen in room TR09C/TR09C and the patient's care was started at 1:55 PM    Chief Complaint  Patient presents with  . Fall  . Back Pain  . Groin Pain   The history is provided by the patient. No language interpreter was used.   HPI Comments: [name] Ramsey is a 39 y.o. male who presents to the Emergency Department complaining of a  Mechanical 3-4 foot fall yesterday. He state he was unloading hay from a trailer when he lost his balance and fell, landing on his back.  He is unsure if he lost consciousness but witness report he did and woke him with cold water.  His only recollection of the fall is having the "wind knocked out of me. " He complains of gradually worsening lower back pain, as well as tingling shooting down the back of both legs to the mid-calf as well as groin pain.  His back pain is worsened with any motion and inspiration.  There are no alleviating factors.  He also reports an episode of resolved hematuria last night, chest pain last night described as soreness, and currently resolved numbness and tingling in his right arm last night.  He denies a history of back problems or similar symptoms.  He reports constant discomfort with no relief. He denies bowel/bladder incontinence, saddle anesthesia, neck pain, headache, SOB or CP, double vision. He reports taking aleve, ibuprofen, and BC, with no relief.  Past Medical History  Diagnosis Date  . Gastric ulcer    Past Surgical History  Procedure Laterality Date  . Fracture surgery    . Cholecystectomy    . Ulcers     No family history on file. History  Substance Use Topics  . Smoking status: Current Every Day Smoker  . Smokeless tobacco: Not on file  . Alcohol Use: Yes    Review of Systems  Eyes: Negative for  visual disturbance.  Respiratory: Negative for shortness of breath.   Cardiovascular: Positive for chest pain.  Genitourinary: Positive for hematuria.  Musculoskeletal: Positive for back pain. Negative for neck pain.  Neurological: Positive for numbness.      Allergies  Aspirin  Home Medications   Prior to Admission medications   Medication Sig Start Date End Date Taking? Authorizing Provider  Aspirin-Salicylamide-Caffeine (BC HEADACHE PO) Take 1 tablet by mouth daily as needed. For headache    Historical Provider, MD  butalbital-acetaminophen-caffeine [facility]) [phone] MG per tablet Take 1 tablet by mouth every 6 (six) hours as needed for headache. 04/24/15 04/23/16  Garlon Hatchet, PA-C  cyclobenzaprine (FLEXERIL) 10 MG tablet Take 1 tablet (10 mg total) by mouth 3 (three) times daily as needed for muscle spasms. Patient not taking: Reported on 04/24/2015 03/23/15   Carmelina Dane, MD  HYDROcodone-acetaminophen [facility]) 5-325 MG per tablet Take 1-2 tablets by mouth every 4 (four) hours as needed. Patient not taking: Reported on 04/24/2015 03/23/15   Carmelina Dane, MD  naproxen sodium (ANAPROX DS) 550 MG tablet Take 1 tablet (550 mg total) by mouth 2 (two) times daily with a meal. Patient not taking: Reported on 04/24/2015 03/23/15 03/22/16  Carmelina Dane, MD   BP 103/49 mmHg  Pulse 65  Temp(Src) 98 F (36.7 C) (Oral)  Resp 18  SpO2 98%  Physical Exam  Constitutional: He appears well-developed and well-nourished. No distress.  HENT:  Head: Normocephalic and atraumatic.  Neck: Neck supple.  Cardiovascular: Normal rate and regular rhythm.   Pulmonary/Chest: Effort normal and breath sounds normal. No respiratory distress. He has no wheezes. He has no rales.  Abdominal: Soft. He exhibits no distension and no mass. There is generalized tenderness. There is guarding. There is no rebound.  Musculoskeletal: He exhibits no edema.       Back:  Diffuse tenderness to his t- and  l-spine.  C-spine nontender.  Extremities:  Strength 5/5, sensation intact, distal pulses intact.      Neurological: He is alert. He exhibits normal muscle tone.  Skin: He is not diaphoretic.  Psychiatric: He has a normal mood and affect. His behavior is normal.  Nursing note and vitals reviewed.   ED Course  Procedures  DIAGNOSTIC STUDIES: Oxygen Saturation is 98% on RA, normal by my interpretation.    COORDINATION OF CARE: 2:03 PM - Discussed plans to order labs, imaging, and pain medication. Pt advised of plan for treatment and pt agrees.  Labs Review Labs Reviewed  CBC WITH DIFFERENTIAL/PLATELET - Abnormal; Notable for the following:    WBC 11.2 (*)    Neutro Abs 8.3 (*)    All other components within normal limits  URINALYSIS, ROUTINE W REFLEX MICROSCOPIC (NOT AT [facility]) - Abnormal; Notable for the following:    APPearance CLOUDY (*)    All other components within normal limits  PROTIME-INR  APTT  I-STAT CHEM 8, ED    Imaging Review Dg Chest 2 View  06/22/2015   CLINICAL DATA:  Chest pain and shortness of breath. Patient fell yesterday.  EXAM: CHEST  2 VIEW  COMPARISON:  Chest radiograph and chest CT July 29, 2012  FINDINGS: Lungs are clear. Heart size and pulmonary vascularity are normal. No adenopathy. No pneumothorax. There is mild degenerative change in the thoracic spine. No acute fracture apparent.  IMPRESSION: No edema or consolidation.   Electronically Signed   By: Bretta Bang III M.D.   On: 06/22/2015 15:14   Dg Cervical Spine Complete  06/22/2015   CLINICAL DATA:  Bilateral hand tingling after fall off bed of truck yesterday. Initial encounter.  EXAM: CERVICAL SPINE  4+ VIEWS  COMPARISON:  None.  FINDINGS: There is no evidence of cervical spine fracture or prevertebral soft tissue swelling. Alignment is normal. No other significant bone abnormalities are identified.  IMPRESSION: Negative cervical spine radiographs.   Electronically Signed   By: Lupita Raider,  M.D.   On: 06/22/2015 15:17     EKG Interpretation None      MDM   Final diagnoses:  Fall, initial encounter  Low back pain, unspecified back pain laterality, with sciatica presence unspecified  Lower abdominal pain    Afebrile, nontoxic patient with fall onto back from trailer yesterday with diffuse low back pain and lower abdominal pain, report of single episode of hematuria last night.  No current hematuria.  Labs remarkable only for mild leukocytosis.  CT abd/pelvis negative, CXR negative. Pt denies headache, neck pain.  Neurovascularly intact.   D/C home with norco, robaxin, PCP follow up.  Discussed return precautions.   Discussed result, findings, treatment, and follow up  with patient.  Pt given return precautions.  Pt verbalizes understanding and agrees with plan.        I personally performed the services described in this documentation, which was scribed in my presence. The recorded  information has been reviewed and is accurate.     Trixie Dredge, PA-C 06/22/15 1755  Mancel Bale, MD 06/26/15 [phone]

## 2015-06-22 NOTE — ED Notes (Signed)
CHARGE NURSE INFORMED OF NEED TO MOVE PT TO ACUTE SIDE.

## 2015-07-01 ENCOUNTER — Encounter (HOSPITAL_COMMUNITY): Payer: Self-pay | Admitting: *Deleted

## 2015-07-01 ENCOUNTER — Emergency Department (HOSPITAL_COMMUNITY)
Admission: EM | Admit: 2015-07-01 | Discharge: 2015-07-01 | Disposition: A | Discharge status: Home / Self Care | Payer: No Typology Code available for payment source | Attending: Emergency Medicine | Admitting: Emergency Medicine

## 2015-07-01 ENCOUNTER — Emergency Department (HOSPITAL_COMMUNITY): Payer: No Typology Code available for payment source

## 2015-07-01 DIAGNOSIS — S29001A Unspecified injury of muscle and tendon of front wall of thorax, initial encounter: Secondary | ICD-10-CM | POA: Insufficient documentation

## 2015-07-01 DIAGNOSIS — Y9339 Activity, other involving climbing, rappelling and jumping off: Secondary | ICD-10-CM | POA: Diagnosis not present

## 2015-07-01 DIAGNOSIS — Y9289 Other specified places as the place of occurrence of the external cause: Secondary | ICD-10-CM | POA: Diagnosis not present

## 2015-07-01 DIAGNOSIS — Y998 Other external cause status: Secondary | ICD-10-CM | POA: Insufficient documentation

## 2015-07-01 DIAGNOSIS — W01198A Fall on same level from slipping, tripping and stumbling with subsequent striking against other object, initial encounter: Secondary | ICD-10-CM | POA: Diagnosis not present

## 2015-07-01 DIAGNOSIS — Z8719 Personal history of other diseases of the digestive system: Secondary | ICD-10-CM | POA: Diagnosis not present

## 2015-07-01 DIAGNOSIS — Z720 Tobacco use: Secondary | ICD-10-CM | POA: Insufficient documentation

## 2015-07-01 DIAGNOSIS — R0789 Other chest pain: Secondary | ICD-10-CM

## 2015-07-01 DIAGNOSIS — S3991XA Unspecified injury of abdomen, initial encounter: Secondary | ICD-10-CM | POA: Diagnosis not present

## 2015-07-01 MED ORDER — OXYCODONE-ACETAMINOPHEN 5-325 MG PO TABS
1.0000 | ORAL_TABLET | Freq: Once | ORAL | Status: AC
  Administered 2015-07-01: 1 via ORAL
  Filled 2015-07-01: qty 1

## 2015-07-01 MED ORDER — TRAMADOL HCL 50 MG PO TABS
50.0000 mg | ORAL_TABLET | Freq: Once | ORAL | Status: AC
  Administered 2015-07-01: 50 mg via ORAL
  Filled 2015-07-01: qty 1

## 2015-07-01 MED ORDER — TRAMADOL HCL 50 MG PO TABS
50.0000 mg | ORAL_TABLET | Freq: Four times a day (QID) | ORAL | Status: DC | PRN
Start: 2015-07-01 — End: 2016-08-05

## 2015-07-01 MED ORDER — ONDANSETRON 4 MG PO TBDP
8.0000 mg | ORAL_TABLET | Freq: Once | ORAL | Status: AC
  Administered 2015-07-01: 8 mg via ORAL
  Filled 2015-07-01: qty 2

## 2015-07-01 MED ORDER — CYCLOBENZAPRINE HCL 10 MG PO TABS: 10.0000 mg | ORAL_TABLET | Freq: Three times a day (TID) | ORAL | Status: DC | PRN

## 2015-07-01 NOTE — ED Notes (Signed)
The pt fell off a broken step going up to the attic just shortly prior to arrival here. Short distance.  The step is old  He has redness and pain in his rt lower ribs pain when breathing

## 2015-07-01 NOTE — Discharge Instructions (Signed)

## 2015-07-01 NOTE — ED Provider Notes (Signed)
CSN: [id number]     Arrival date & time 07/01/15  1913 History   First MD Initiated Contact with Patient 07/01/15 1954     Chief Complaint  Patient presents with  . Fall     (Consider location/radiation/quality/duration/timing/severity/associated sxs/prior Treatment) HPI   39 year old male presents for evaluation of  Chest injury. Patient states that the was trying to climb off the attic 2 hrs prior to coming to ER.  He step on the second step from the top, which broke and cause pt to slip, striking his right side of her chest against the railing. He did not fall down to the ground but complaining of acute onset of sharp pain to his right side of chest. Pain is 10 out of 10, persistent, worsening with breathing. No specific treatment tried. He denies any precipitating symptoms prior to the injury. He is not on any blood thinner medication. No headache, back pain, hip pain, or pain to his extremities.  Past Medical History  Diagnosis Date  . Gastric ulcer    Past Surgical History  Procedure Laterality Date  . Fracture surgery    . Cholecystectomy    . Ulcers     No family history on file. History  Substance Use Topics  . Smoking status: Current Every Day Smoker  . Smokeless tobacco: Not on file  . Alcohol Use: Yes    Review of Systems  Constitutional: Negative for fever.  Respiratory: Negative for shortness of breath.   Cardiovascular: Positive for chest pain.  Gastrointestinal: Negative for abdominal pain.  Musculoskeletal: Negative for back pain.  Neurological: Negative for light-headedness and headaches.      Allergies  Aspirin  Home Medications   Prior to Admission medications   Medication Sig Start Date End Date Taking? Authorizing Provider  Aspirin-Salicylamide-Caffeine (BC HEADACHE PO) Take 1 tablet by mouth daily as needed. For headache    Historical Provider, MD  butalbital-acetaminophen-caffeine [facility]) [phone] MG per tablet Take 1 tablet by mouth every  6 (six) hours as needed for headache. 04/24/15 04/23/16  Garlon HatchetLisa M Sanders, PA-C  cyclobenzaprine (FLEXERIL) 10 MG tablet Take 1 tablet (10 mg total) by mouth 3 (three) times daily as needed for muscle spasms. Patient not taking: Reported on 04/24/2015 03/23/15   Carmelina DaneJeffery S Anderson, MD  HYDROcodone-acetaminophen (NORCO/VICODIN) 5-325 MG per tablet Take 1-2 tablets by mouth every 4 (four) hours as needed for moderate pain or severe pain. 06/22/15   Trixie DredgeEmily West, PA-C  methocarbamol (ROBAXIN) 750 MG tablet Take 1 tablet (750 mg total) by mouth every 6 (six) hours as needed for muscle spasms (or pain). 06/22/15   Trixie DredgeEmily West, PA-C  naproxen sodium (ANAPROX DS) 550 MG tablet Take 1 tablet (550 mg total) by mouth 2 (two) times daily with a meal. Patient not taking: Reported on 04/24/2015 03/23/15 03/22/16  Carmelina DaneJeffery S Anderson, MD   BP 123/81 mmHg  Pulse 97  Temp(Src) 98.7 F (37.1 C)  Resp 24  Ht 5\' 5"  (1.651 m)  Wt 177 lb 1 oz (80.315 kg)  BMI 29.46 kg/m2  SpO2 97% Physical Exam  Constitutional: He appears well-developed and well-nourished. No distress.  Caucasian male, appears uncomfortable, holding his right side of chest.  HENT:  Head: Atraumatic.  Eyes: Conjunctivae are normal.  Neck: Neck supple.  Cardiovascular: Normal rate and regular rhythm.   Pulmonary/Chest: Effort normal and breath sounds normal. He exhibits tenderness (tenderness to right lateral inferior chest wall with mild redness but no crepitus or emphysema.).  Abdominal: There  is tenderness (Diffuse abdominal tenderness even with light touch. Abdomen is nondistended. No bruising noted.).  Neurological: He is alert.  Skin: No rash noted.  Psychiatric: He has a normal mood and affect.  Nursing note and vitals reviewed.   ED Course  Procedures (including critical care time)  Patient with mechanical injury to right side of chest wall. This is a low impact injury. Patient exhibits pain even to mild palpation throughout right chest wall  and entire abdomen. Right wrist x-ray obtained showing no evidence of rib fracture. No free air. At this time I have low suspicion for internal injury such as liver laceration of kidney laceration from impact however I did discuss options of obtaining abdominal CT scan for further evaluation. Patient made aware of risk and benefits of the imaging including radiation exposure. Patient had a recent CT scan for a recent mechanical injury several days ago and the result was unremarkable. At this time patient requests no additional CT scan, he can return if symptoms worsen. I agree.  Patient is able to make informed decision.  Labs Review Labs Reviewed - No data to display  Imaging Review Dg Ribs Unilateral W/chest Right  07/01/2015   CLINICAL DATA:  Larey Seat down attic pull down stairs tonight, landing on the right chest. Pain with difficulty breathing.  EXAM: RIGHT RIBS AND CHEST - 3+ VIEW  COMPARISON:  06/22/2015  FINDINGS: Heart size is normal. Mediastinal shadows are normal. Lungs are clear. No pneumothorax or hemothorax.  Right rib films do not show any right-sided rib fracture. Marker overlies the region of the costal cartilage. Clips in the right upper quadrant indicate previous cholecystectomy.  IMPRESSION: Negative. No active cardiopulmonary disease. No visible rib fracture.   Electronically Signed   By: Paulina Fusi M.D.   On: 07/01/2015 20:08     EKG Interpretation None      MDM   Final diagnoses:  Right-sided chest wall pain    BP 128/67 mmHg  Pulse 75  Temp(Src) 98.7 F (37.1 C)  Resp 24  Ht  (1.651 m)  Wt 177 lb 1 oz (80.315 kg)  BMI 29.46 kg/m2  SpO2 93%  I have reviewed nursing notes and vital signs. I personally viewed the imaging tests through PACS system and agrees with radiologist's intepretation I reviewed available ER/hospitalization records through the EMR    Fayrene Helper, PA-C 07/01/15 2038  Rolland Porter, MD 07/06/15 0001

## 2016-03-18 ENCOUNTER — Encounter (HOSPITAL_COMMUNITY): Payer: Self-pay

## 2016-03-18 ENCOUNTER — Emergency Department (HOSPITAL_COMMUNITY)
Admission: EM | Admit: 2016-03-18 | Discharge: 2016-03-18 | Disposition: A | Discharge status: Home / Self Care | Payer: Self-pay | Attending: Emergency Medicine | Admitting: Emergency Medicine

## 2016-03-18 ENCOUNTER — Emergency Department (HOSPITAL_COMMUNITY): Payer: No Typology Code available for payment source

## 2016-03-18 DIAGNOSIS — R6883 Chills (without fever): Secondary | ICD-10-CM | POA: Insufficient documentation

## 2016-03-18 DIAGNOSIS — F17200 Nicotine dependence, unspecified, uncomplicated: Secondary | ICD-10-CM | POA: Insufficient documentation

## 2016-03-18 DIAGNOSIS — R531 Weakness: Secondary | ICD-10-CM | POA: Insufficient documentation

## 2016-03-18 DIAGNOSIS — R0602 Shortness of breath: Secondary | ICD-10-CM | POA: Insufficient documentation

## 2016-03-18 DIAGNOSIS — R05 Cough: Secondary | ICD-10-CM | POA: Insufficient documentation

## 2016-03-18 DIAGNOSIS — R61 Generalized hyperhidrosis: Secondary | ICD-10-CM | POA: Insufficient documentation

## 2016-03-18 DIAGNOSIS — R51 Headache: Secondary | ICD-10-CM | POA: Insufficient documentation

## 2016-03-18 DIAGNOSIS — R079 Chest pain, unspecified: Secondary | ICD-10-CM | POA: Insufficient documentation

## 2016-03-18 LAB — CBC
HCT: 51.5 % (ref 39.0–52.0)
Hemoglobin: 17.3 g/dL — ABNORMAL HIGH (ref 13.0–17.0)
MCH: 31.1 pg (ref 26.0–34.0)
MCHC: 33.6 g/dL (ref 30.0–36.0)
MCV: 92.5 fL (ref 78.0–100.0)
Platelets: 351 10*3/uL (ref 150–400)
RBC: 5.57 MIL/uL (ref 4.22–5.81)
RDW: 13.3 % (ref 11.5–15.5)
WBC: 11 10*3/uL — ABNORMAL HIGH (ref 4.0–10.5)

## 2016-03-18 LAB — BASIC METABOLIC PANEL
Anion gap: 10 (ref 5–15)
BUN: 11 mg/dL (ref 6–20)
CALCIUM: 9.4 mg/dL (ref 8.9–10.3)
CO2: 24 mmol/L (ref 22–32)
Chloride: 107 mmol/L (ref 101–111)
Creatinine, Ser: 0.75 mg/dL (ref 0.61–1.24)
GFR calc non Af Amer: >60 mL/min (ref >60)
GLUCOSE: 84 mg/dL (ref 65–99)
POTASSIUM: 4.5 mmol/L (ref 3.5–5.1)
SODIUM: 141 mmol/L (ref 135–145)

## 2016-03-18 LAB — I-STAT TROPONIN, ED: Troponin i, poc: 0 ng/mL (ref 0.00–0.08)

## 2016-03-18 NOTE — ED Notes (Signed)
Pt. Developed chest tightness, sob, cough for 5 days.  Also  Has a headache.  Skin is warm and dry.  Alert and oriented X3.  Pt/ reports having chills and sweats.  Pt. Feels weak.  Intermittent dry cough present.  WAS productive a few days ago, grey in color.  ECG and Labs completed in Triage

## 2016-03-18 NOTE — ED Notes (Signed)
Patient called for X2 by Guido SanderGregory Nikolich, RN and Cher NakaiLori Jahmere Bramel, Rn , unable to find the pt. He is not outside or in our Ed

## 2016-08-05 ENCOUNTER — Encounter (HOSPITAL_COMMUNITY): Payer: Self-pay

## 2016-08-05 ENCOUNTER — Emergency Department (HOSPITAL_COMMUNITY)
Admission: EM | Admit: 2016-08-05 | Discharge: 2016-08-05 | Disposition: A | Discharge status: Home / Self Care | Payer: Self-pay | Attending: Emergency Medicine | Admitting: Emergency Medicine

## 2016-08-05 DIAGNOSIS — F17200 Nicotine dependence, unspecified, uncomplicated: Secondary | ICD-10-CM | POA: Insufficient documentation

## 2016-08-05 DIAGNOSIS — K047 Periapical abscess without sinus: Secondary | ICD-10-CM | POA: Insufficient documentation

## 2016-08-05 LAB — CBC WITH DIFFERENTIAL/PLATELET
BASOS ABS: 0.1 10*3/uL (ref 0.0–0.1)
Basophils Relative: 1 %
Eosinophils Absolute: 0.2 10*3/uL (ref 0.0–0.7)
Eosinophils Relative: 1 %
HCT: 49 % (ref 39.0–52.0)
Hemoglobin: 16.4 g/dL (ref 13.0–17.0)
LYMPHS ABS: 1.9 10*3/uL (ref 0.7–4.0)
LYMPHS PCT: 16 %
MCH: 31.4 pg (ref 26.0–34.0)
MCHC: 33.5 g/dL (ref 30.0–36.0)
MCV: 93.7 fL (ref 78.0–100.0)
MONO ABS: 0.8 10*3/uL (ref 0.1–1.0)
MONOS PCT: 7 %
NEUTROS ABS: 8.6 10*3/uL — AB (ref 1.7–7.7)
NEUTROS PCT: 75 %
PLATELETS: 307 10*3/uL (ref 150–400)
RBC: 5.23 MIL/uL (ref 4.22–5.81)
RDW: 13.6 % (ref 11.5–15.5)
WBC: 11.6 10*3/uL — ABNORMAL HIGH (ref 4.0–10.5)

## 2016-08-05 LAB — COMPREHENSIVE METABOLIC PANEL
ALBUMIN: 3.6 g/dL (ref 3.5–5.0)
ALK PHOS: 94 U/L (ref 38–126)
ALT: 29 U/L (ref 17–63)
ANION GAP: 5 (ref 5–15)
AST: 22 U/L (ref 15–41)
BUN: 11 mg/dL (ref 6–20)
CHLORIDE: 110 mmol/L (ref 101–111)
CO2: 24 mmol/L (ref 22–32)
Calcium: 8.7 mg/dL — ABNORMAL LOW (ref 8.9–10.3)
Creatinine, Ser: 0.62 mg/dL (ref 0.61–1.24)
GFR calc non Af Amer: >60 mL/min (ref >60)
GLUCOSE: 97 mg/dL (ref 65–99)
Potassium: 4.1 mmol/L (ref 3.5–5.1)
SODIUM: 139 mmol/L (ref 135–145)
Total Bilirubin: 0.4 mg/dL (ref 0.3–1.2)
Total Protein: 5.8 g/dL — ABNORMAL LOW (ref 6.5–8.1)

## 2016-08-05 LAB — LIPASE, BLOOD: Lipase: 20 U/L (ref 11–51)

## 2016-08-05 MED ORDER — AMOXICILLIN 500 MG PO CAPS
500.0000 mg | ORAL_CAPSULE | Freq: Once | ORAL | Status: AC
  Administered 2016-08-05: 500 mg via ORAL
  Filled 2016-08-05: qty 1

## 2016-08-05 MED ORDER — AMOXICILLIN 500 MG PO CAPS: 1000.0000 mg | ORAL_CAPSULE | Freq: Two times a day (BID) | ORAL | 0 refills | Status: DC

## 2016-08-05 MED ORDER — BUPIVACAINE-EPINEPHRINE (PF) 0.5% -1:200000 IJ SOLN
1.8000 mL | Freq: Once | INTRAMUSCULAR | Status: AC
  Administered 2016-08-05: 1.8 mL
  Filled 2016-08-05: qty 1.8

## 2016-08-05 MED ORDER — SODIUM CHLORIDE 0.9 % IV BOLUS (SEPSIS)
1000.0000 mL | Freq: Once | INTRAVENOUS | Status: AC
  Administered 2016-08-05: 1000 mL via INTRAVENOUS

## 2016-08-05 MED ORDER — HYDROCODONE-ACETAMINOPHEN 5-325 MG PO TABS: ORAL_TABLET | ORAL | 0 refills | Status: DC

## 2016-08-05 MED ORDER — ONDANSETRON HCL 4 MG/2ML IJ SOLN
4.0000 mg | Freq: Once | INTRAMUSCULAR | Status: AC
  Administered 2016-08-05: 4 mg via INTRAVENOUS
  Filled 2016-08-05: qty 2

## 2016-08-05 MED ORDER — PROMETHAZINE HCL 25 MG PO TABS: 25.0000 mg | ORAL_TABLET | Freq: Four times a day (QID) | ORAL | 0 refills | Status: DC | PRN

## 2016-08-05 NOTE — Discharge Instructions (Signed)
Eat a soft diet for the next 24-48 hours.  Rinse your mouth with saltwater or regular water after you eat any food.  Do not swallow blood or pus becuase it will cause you to vomit,  spit out instead.  Take vicodin for breakthrough pain, do not drink alcohol, drive, care for children or do other critical tasks while taking vicodin.   Return to the emergency room for fever, change in vision, redness to the face that rapidly spreads towards the eye, nausea or vomiting, difficulty swallowing or shortness of breath.   Apply warm compresses to jaw throughout the day.   Take your antibiotics as directed and to the end of the course. DO NOT drink alcohol when taking metronidazole, it will make you very sick!   Followup with a dentist is very important for ongoing evaluation and management of recurrent dental pain. Return to emergency department for emergent changing or worsening symptoms.

## 2016-08-05 NOTE — ED Provider Notes (Signed)
[facility]-EMERGENCY DEPT Provider Note   CSN: [id number] Arrival date & time: 08/05/16  2536  First Provider Contact:  First MD Initiated Contact with Patient 08/05/16 [phone]        History   Chief Complaint Chief Complaint  Patient presents with  . Dental Pain    HPI  Blood pressure 123/69, pulse 66, temperature 98.2 F (36.8 C), temperature source Oral, resp. rate 15, height 5\' 5"  (1.651 m), weight 77.1 kg, SpO2 97 %.  [name] Ramsey is a 40 y.o. male complaining of complaining of dental pain in the right upper jaw onset approximately 5 days ago followed by tactile fever and several episodes of nonbloody, nonbilious, no coffee-ground emesis starting 3 days ago. Pain is severe, he taken multiple pain medications at home with little relief. He hasn't vomited in the last day. He also reports associated swelling to the area.  HPI  Past Medical History:  Diagnosis Date  . Gastric ulcer     There are no active problems to display for this patient.   Past Surgical History:  Procedure Laterality Date  . CHOLECYSTECTOMY    . FRACTURE SURGERY    . ulcers         Home Medications    Prior to Admission medications   Medication Sig Start Date End Date Taking? Authorizing Provider  naproxen sodium (ANAPROX) 220 MG tablet Take 220 mg by mouth 2 (two) times daily as needed (for pain).   Yes Historical Provider, MD  amoxicillin (AMOXIL) 500 MG capsule Take 2 capsules (1,000 mg total) by mouth 2 (two) times daily. 08/05/16   Tilia Faso, PA-C  HYDROcodone-acetaminophen (NORCO/VICODIN) 5-325 MG tablet Take 1-2 tablets by mouth every 6 hours as needed for pain and/or cough. 08/05/16   Bertrand Vowels, PA-C  promethazine (PHENERGAN) 25 MG tablet Take 1 tablet (25 mg total) by mouth every 6 (six) hours as needed for nausea or vomiting. 08/05/16   Joni Reining Tyray Proch, PA-C    Family History History reviewed. No pertinent family history.  Social History Social History  Substance Use Topics   . Smoking status: Current Every Day Smoker  . Smokeless tobacco: Never Used  . Alcohol use Yes     Comment: occasional      Allergies   Aspirin   Review of Systems Review of Systems   Physical Exam Updated Vital Signs BP 107/58   Pulse (!) 59   Temp 98.2 F (36.8 C) (Oral)   Resp 15   Ht 5\' 5"  (1.651 m)   Wt 77.1 kg   SpO2 96%   BMI 28.29 kg/m   Physical Exam  Constitutional: He is oriented to person, place, and time. He appears well-developed and well-nourished. No distress.  HENT:  Head: Normocephalic and atraumatic.  Mouth/Throat: Oropharynx is clear and moist.    Generally poor dentition, focal fluctuance as diagrammed, no active drainage. Patient is handling their secretions. There is no tenderness to palpation or firmness underneath tongue bilaterally. No trismus.    Eyes: Conjunctivae and EOM are normal. Pupils are equal, round, and reactive to light.  Neck: Normal range of motion.  Cardiovascular: Normal rate, regular rhythm and intact distal pulses.   Pulmonary/Chest: Effort normal and breath sounds normal.  Abdominal: Soft. There is no tenderness.  Musculoskeletal: Normal range of motion.  Neurological: He is alert and oriented to person, place, and time.  Skin: He is not diaphoretic.  Psychiatric: He has a normal mood and affect.  Nursing note and vitals reviewed.  ED Treatments / Results  Labs (all labs ordered are listed, but only abnormal results are displayed) Labs Reviewed  COMPREHENSIVE METABOLIC PANEL - Abnormal; Notable for the following:       Result Value   Calcium 8.7 (*)    Total Protein 5.8 (*)    All other components within normal limits  CBC WITH DIFFERENTIAL/PLATELET - Abnormal; Notable for the following:    WBC 11.6 (*)    Neutro Abs 8.6 (*)    All other components within normal limits  LIPASE, BLOOD    EKG  EKG Interpretation None       Radiology No results found.  Procedures .Marland KitchenIncision and  Drainage Date/Time: 08/05/2016 10:37 AM Performed by: Wynetta Emery Authorized by: Wynetta Emery   Consent:    Consent obtained:  Verbal   Consent given by:  Patient   Risks discussed:  Infection   Alternatives discussed:  No treatment and delayed treatment Location:    Type:  Abscess   Size:  1 cm   Location:  Mouth   Mouth location:  Alveolar process Anesthesia (see MAR for exact dosages):    Anesthesia method:  Local infiltration and nerve block Procedure type:    Complexity:  Complex   (including critical care time)  Medications Ordered in ED Medications  sodium chloride 0.9 % bolus 1,000 mL (0 mLs Intravenous Stopped 08/05/16 1002)  bupivacaine-epinephrine (MARCAINE W/ EPI) 0.5% -1:200000 injection 1.8 mL (1.8 mLs Infiltration Given by Other 08/05/16 0950)  bupivacaine-epinephrine (MARCAINE W/ EPI) 0.5% -1:200000 injection 1.8 mL (1.8 mLs Infiltration Given by Other 08/05/16 0950)  ondansetron (ZOFRAN) injection 4 mg (4 mg Intravenous Given 08/05/16 0949)  amoxicillin (AMOXIL) capsule 500 mg (500 mg Oral Given 08/05/16 0954)     Initial Impression / Assessment and Plan / ED Course  I have reviewed the triage vital signs and the nursing notes.  Pertinent labs & imaging results that were available during my care of the patient were reviewed by me and considered in my medical decision making (see chart for details).  Clinical Course    Vitals:   08/05/16 0928 08/05/16 0945 08/05/16 1000 08/05/16 1055  BP: 122/92 132/84 107/58 107/58  Pulse: 67 60 (!) 59 (!) 59  Resp: Temp:      TempSrc:      SpO2: 97% 98% 96% 96%  Weight:      Height:        Medications  sodium chloride 0.9 % bolus 1,000 mL (0 mLs Intravenous Stopped 08/05/16 1002)  bupivacaine-epinephrine (MARCAINE W/ EPI) 0.5% -1:200000 injection 1.8 mL (1.8 mLs Infiltration Given by Other 08/05/16 0950)  bupivacaine-epinephrine (MARCAINE W/ EPI) 0.5% -1:200000 injection 1.8 mL (1.8 mLs Infiltration Given  by Other 08/05/16 0950)  ondansetron (ZOFRAN) injection 4 mg (4 mg Intravenous Given 08/05/16 0949)  amoxicillin (AMOXIL) capsule 500 mg (500 mg Oral Given 08/05/16 0954)    [name] D Hollett is 40 y.o. male presenting with Dental abscess, no signs of systemic infection but patient reports a tactile fever earlier. Patient will be started on amoxicillin and I and D is performed, dental resources given.   Evaluation does not show pathology that would require ongoing emergent intervention or inpatient treatment. Pt is hemodynamically stable and mentating appropriately. Discussed findings and plan with patient/guardian, who agrees with care plan. All questions answered. Return precautions discussed and outpatient follow up given.      Final Clinical Impressions(s) / ED Diagnoses   Final diagnoses:  Dental abscess    New Prescriptions Discharge Medication List as of 08/05/2016 10:33 AM    START taking these medications   Details  amoxicillin (AMOXIL) 500 MG capsule Take 2 capsules (1,000 mg total) by mouth 2 (two) times daily., Starting Mon 08/05/2016, Print    promethazine (PHENERGAN) 25 MG tablet Take 1 tablet (25 mg total) by mouth every 6 (six) hours as needed for nausea or vomiting., Starting Mon 08/05/2016, [employer]         Shirel Mallis, PA-C 08/05/16 1343    Rolan BuccoMelanie Belfi, MD 08/05/16 1553

## 2016-08-05 NOTE — ED Triage Notes (Signed)
Pt. Here for tooth pain on right top that has worsened since Thursday. Pt. sts the swelling started Friday and he's been throwing up. Pt. sts he had that tooth removed while in prison, but thinks some may be left in there. Many black areas and swelling noted upon inspection of his mouth.

## 2016-08-05 NOTE — ED Notes (Signed)
Pt. Given water to drink. Pt. Had no issues and denies nausea at this time.

## 2016-12-24 ENCOUNTER — Emergency Department (HOSPITAL_COMMUNITY): Payer: Self-pay

## 2016-12-24 ENCOUNTER — Encounter (HOSPITAL_COMMUNITY): Payer: Self-pay | Admitting: Vascular Surgery

## 2016-12-24 ENCOUNTER — Emergency Department (HOSPITAL_COMMUNITY)
Admission: EM | Admit: 2016-12-24 | Discharge: 2016-12-24 | Disposition: A | Discharge status: Home / Self Care | Payer: Self-pay | Attending: Emergency Medicine | Admitting: Emergency Medicine

## 2016-12-24 DIAGNOSIS — R072 Precordial pain: Secondary | ICD-10-CM

## 2016-12-24 DIAGNOSIS — R0789 Other chest pain: Secondary | ICD-10-CM

## 2016-12-24 DIAGNOSIS — R058 Other specified cough: Secondary | ICD-10-CM

## 2016-12-24 DIAGNOSIS — R05 Cough: Secondary | ICD-10-CM

## 2016-12-24 DIAGNOSIS — Z79899 Other long term (current) drug therapy: Secondary | ICD-10-CM | POA: Insufficient documentation

## 2016-12-24 DIAGNOSIS — F17200 Nicotine dependence, unspecified, uncomplicated: Secondary | ICD-10-CM | POA: Insufficient documentation

## 2016-12-24 DIAGNOSIS — J209 Acute bronchitis, unspecified: Secondary | ICD-10-CM

## 2016-12-24 LAB — BASIC METABOLIC PANEL
ANION GAP: 8 (ref 5–15)
BUN: 10 mg/dL (ref 6–20)
CHLORIDE: 107 mmol/L (ref 101–111)
CO2: 25 mmol/L (ref 22–32)
Calcium: 8.8 mg/dL — ABNORMAL LOW (ref 8.9–10.3)
Creatinine, Ser: 0.71 mg/dL (ref 0.61–1.24)
GFR calc Af Amer: >60 mL/min (ref >60)
GLUCOSE: 96 mg/dL (ref 65–99)
POTASSIUM: 4 mmol/L (ref 3.5–5.1)
Sodium: 140 mmol/L (ref 135–145)

## 2016-12-24 LAB — CBC
HEMATOCRIT: 50.1 % (ref 39.0–52.0)
HEMOGLOBIN: 16.8 g/dL (ref 13.0–17.0)
MCH: 31.4 pg (ref 26.0–34.0)
MCHC: 33.5 g/dL (ref 30.0–36.0)
MCV: 93.6 fL (ref 78.0–100.0)
Platelets: 346 10*3/uL (ref 150–400)
RBC: 5.35 MIL/uL (ref 4.22–5.81)
RDW: 13.8 % (ref 11.5–15.5)
WBC: 10.8 10*3/uL — AB (ref 4.0–10.5)

## 2016-12-24 LAB — I-STAT TROPONIN, ED: Troponin i, poc: 0 ng/mL (ref 0.00–0.08)

## 2016-12-24 MED ORDER — AZITHROMYCIN 250 MG PO TABS
500.0000 mg | ORAL_TABLET | Freq: Once | ORAL | Status: AC
  Administered 2016-12-24: 500 mg via ORAL
  Filled 2016-12-24: qty 2

## 2016-12-24 MED ORDER — IBUPROFEN 400 MG PO TABS
400.0000 mg | ORAL_TABLET | Freq: Once | ORAL | Status: AC
  Administered 2016-12-24: 400 mg via ORAL
  Filled 2016-12-24: qty 1

## 2016-12-24 MED ORDER — HYDROCODONE-ACETAMINOPHEN 5-325 MG PO TABS
1.0000 | ORAL_TABLET | Freq: Once | ORAL | Status: AC
  Administered 2016-12-24: 1 via ORAL
  Filled 2016-12-24: qty 1

## 2016-12-24 MED ORDER — AZITHROMYCIN 250 MG PO TABS: ORAL_TABLET | ORAL | 0 refills | Status: DC

## 2016-12-24 NOTE — Discharge Instructions (Signed)
It was our pleasure to provide your ER care today - we hope that you feel better.  Take zithromax (antibiotic) as prescribed.  Take tylenol and/or motrin as need for pain.  You may try over the counter cough medication such as mucinex as need.   Avoid any smoking.   If heartburn/reflux symptoms, take pepcid and maalox as need.   For chest pain, follow up with cardiologist in the next 1-2 weeks - see referral - call office to arrange appointment.   Also have your blood pressure rechecked then, as it is high today.  Return to ER if worse, difficulty breathing, persistent vomiting, recurrent/persistent chest pain, other concern.  You were given pain medication in the ER - no driving for the next 4 hours.

## 2016-12-24 NOTE — ED Provider Notes (Signed)
[facility]-EMERGENCY DEPT Provider Note   CSN: [id number] Arrival date & time: 12/24/16  1309   By signing my name below, I, Cynda AcresHailei Fulton, attest that this documentation has been prepared under the direction and in the presence of Cathren LaineKevin Kaiser Belluomini, MD. Electronically Signed: Cynda AcresHailei Fulton, Scribe. 12/24/16. 2:28 PM.  History   Chief Complaint Chief Complaint  Patient presents with  . Chest Pain    HPI Comments: [name] Ramsey is a 40 y.o. male who presents to the Emergency Department complaining of intermittent substernal chest pain that began yesterday. Patient reports associated heart burn, coughing, hemoptysis, rhinorrhea, vomiting, shortness of breath, muscle spasms, and diaphoresis. He describes the severity of his pain as a 10/10 and notes the pain is sharp. Patient is a current smoker. He has not had regular bowel movements. He denies any past heart problems, diarrhea, or fever.  Symptoms constant since yesterday. At rest. No relation to exertion. Is worse w cough/coughing episode.    The history is provided by the patient. No language interpreter was used.    Past Medical History:  Diagnosis Date  . Gastric ulcer     There are no active problems to display for this patient.   Past Surgical History:  Procedure Laterality Date  . CHOLECYSTECTOMY    . FRACTURE SURGERY    . ulcers         Home Medications    Prior to Admission medications   Medication Sig Start Date End Date Taking? Authorizing Provider  amoxicillin (AMOXIL) 500 MG capsule Take 2 capsules (1,000 mg total) by mouth 2 (two) times daily. 08/05/16   Nicole Pisciotta, PA-C  HYDROcodone-acetaminophen (NORCO/VICODIN) 5-325 MG tablet Take 1-2 tablets by mouth every 6 hours as needed for pain and/or cough. 08/05/16   Nicole Pisciotta, PA-C  naproxen sodium (ANAPROX) 220 MG tablet Take 220 mg by mouth 2 (two) times daily as needed (for pain).    Historical Provider, MD  promethazine (PHENERGAN) 25 MG tablet Take 1  tablet (25 mg total) by mouth every 6 (six) hours as needed for nausea or vomiting. 08/05/16   Joni ReiningNicole Pisciotta, PA-C    Family History No family history on file.  Social History Social History  Substance Use Topics  . Smoking status: Current Every Day Smoker  . Smokeless tobacco: Never Used  . Alcohol use Yes     Comment: occasional      Allergies   Aspirin   Review of Systems Review of Systems  Constitutional: Positive for diaphoresis.  Respiratory: Positive for cough and shortness of breath.   Cardiovascular: Positive for chest pain.  Gastrointestinal: Positive for vomiting. Negative for diarrhea.  All other systems reviewed and are negative.    Physical Exam Updated Vital Signs BP (!) 156/111 (BP Location: Right Arm)   Pulse 68   Temp 97.7 F (36.5 C) (Oral)   Resp 16   SpO2 98%   Physical Exam  Constitutional: He is oriented to person, place, and time. He appears well-developed and well-nourished.  HENT:  Head: Normocephalic and atraumatic.  Eyes: EOM are normal.  Neck: Normal range of motion. Neck supple.  Cardiovascular: Normal rate, regular rhythm, normal heart sounds and intact distal pulses.  Exam reveals no gallop and no friction rub.   No murmur heard. Pulmonary/Chest: Effort normal and breath sounds normal. No respiratory distress. He exhibits tenderness.  Patient has rhonchi in the left chest.   Abdominal: Soft. He exhibits no distension. There is no tenderness.  Musculoskeletal: Normal range  of motion. He exhibits no edema or tenderness.  No leg pain or swelling.   Lymphadenopathy:    He has no cervical adenopathy.  Neurological: He is alert and oriented to person, place, and time.  Psychiatric: He has a normal mood and affect. Judgment normal.  Nursing note and vitals reviewed.    ED Treatments / Results  DIAGNOSTIC STUDIES: Oxygen Saturation is 98% on RA, normal by my interpretation.    COORDINATION OF CARE: 2:29 PM Discussed treatment  plan with pt at bedside and pt agreed to plan.   Labs (all labs ordered are listed, but only abnormal results are displayed) Labs Reviewed  BASIC METABOLIC PANEL  CBC  I-STAT TROPOININ, ED    EKG  EKG Interpretation  Date/Time:  Tuesday December 24 2016 13:16:55 EST Ventricular Rate:  66 PR Interval:  112 QRS Duration: 86 QT Interval:  384 QTC Calculation: 402 R Axis:   54 Text Interpretation:  Normal sinus rhythm No significant change since last tracing Confirmed by Denton LankSTEINL  MD, Caryn BeeKEVIN ([id number]) on 12/24/2016 1:48:57 PM       Radiology No results found.  Procedures Procedures (including critical care time)  Medications Ordered in ED Medications - No data to display   Initial Impression / Assessment and Plan / ED Course  I have reviewed the triage vital signs and the nursing notes.  Pertinent labs & imaging results that were available during my care of the patient were reviewed by me and considered in my medical decision making (see chart for details).  Clinical Course     After symptoms, constant, x 1 days time, trop 0.  Patient with chest wall tenderness reproducing symptoms.   Pt has ride, does not have to drive.  Motrin po. Hydrocodone po.  zithromax po.   Discussed close outpt pcp and card f/u including f/u chest pain and elev bp.   Patient currently appears stable for d/c.     Final Clinical Impressions(s) / ED Diagnoses   Final diagnoses:  None    New Prescriptions New Prescriptions   No medications on file   I personally performed the services described in this documentation, which was scribed in my presence. The recorded information has been reviewed and considered. Cathren LaineKevin Zyasia Halbleib, MD     Cathren LaineKevin Pedrohenrique [place], MD 12/24/16 [phone]

## 2016-12-24 NOTE — ED Triage Notes (Signed)
Pt reports to the ED for eval of CP since yesterday. Pt reports associated symptoms of N/V, SOB, and diaphoresis. Pt describes the pain as a stabbing, pressure. Reports remote hx of cocaine use. Pt reports he did cough up some blood yesterday as well. Pt denies any cardiac hx. Pt reports he has had cough recently. Pt reports the pain is worse with movement and deep inspiration.

## 2016-12-24 NOTE — ED Notes (Signed)
Pt in xray

## 2017-07-22 IMAGING — DX DG CHEST 2V
2 series · 2 of 2 positions shown · non-contrast
Comparison: March 18, 2016

CLINICAL DATA: Shortness of breath and chest pain

EXAM:
CHEST  2 VIEW

[chest pa]
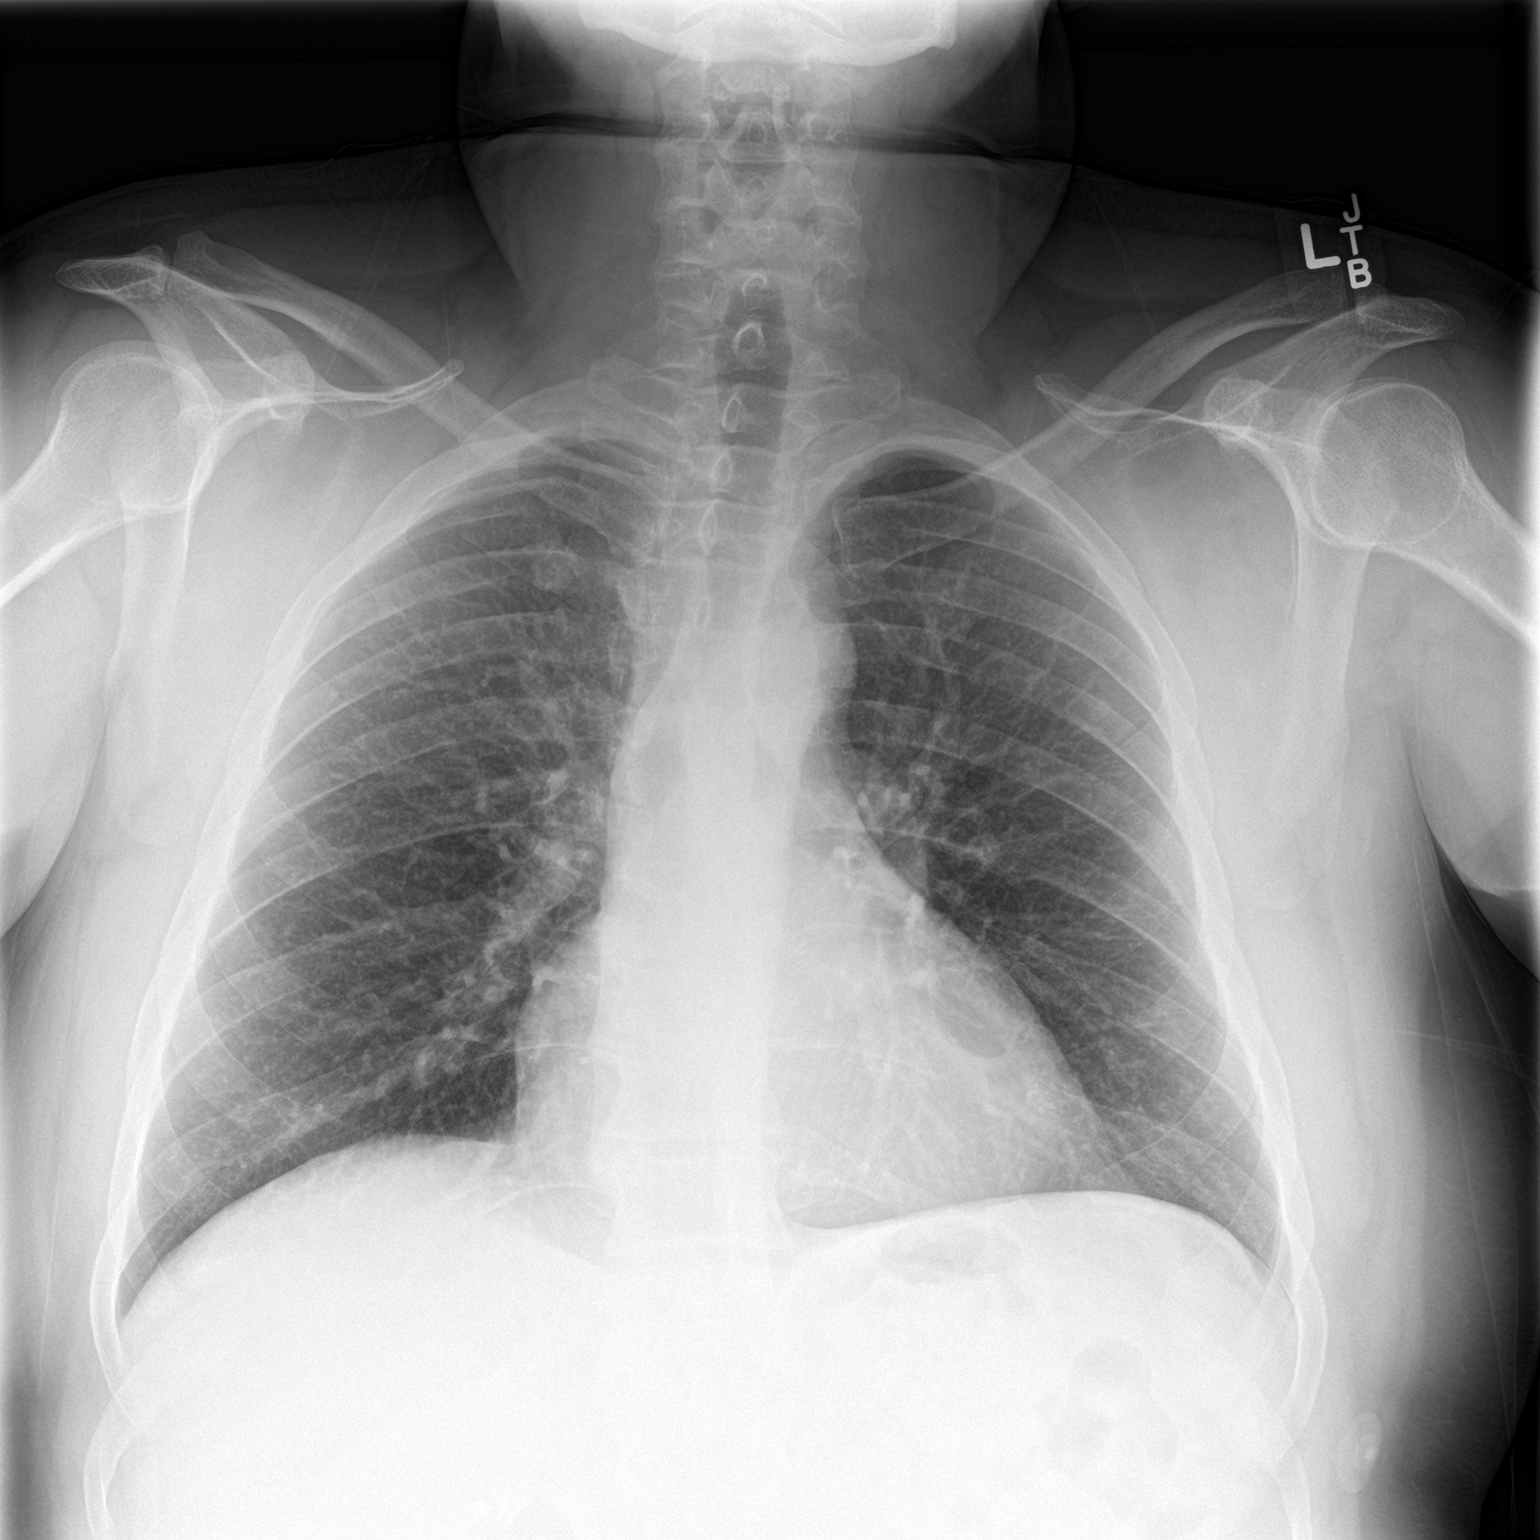

[chest lat]
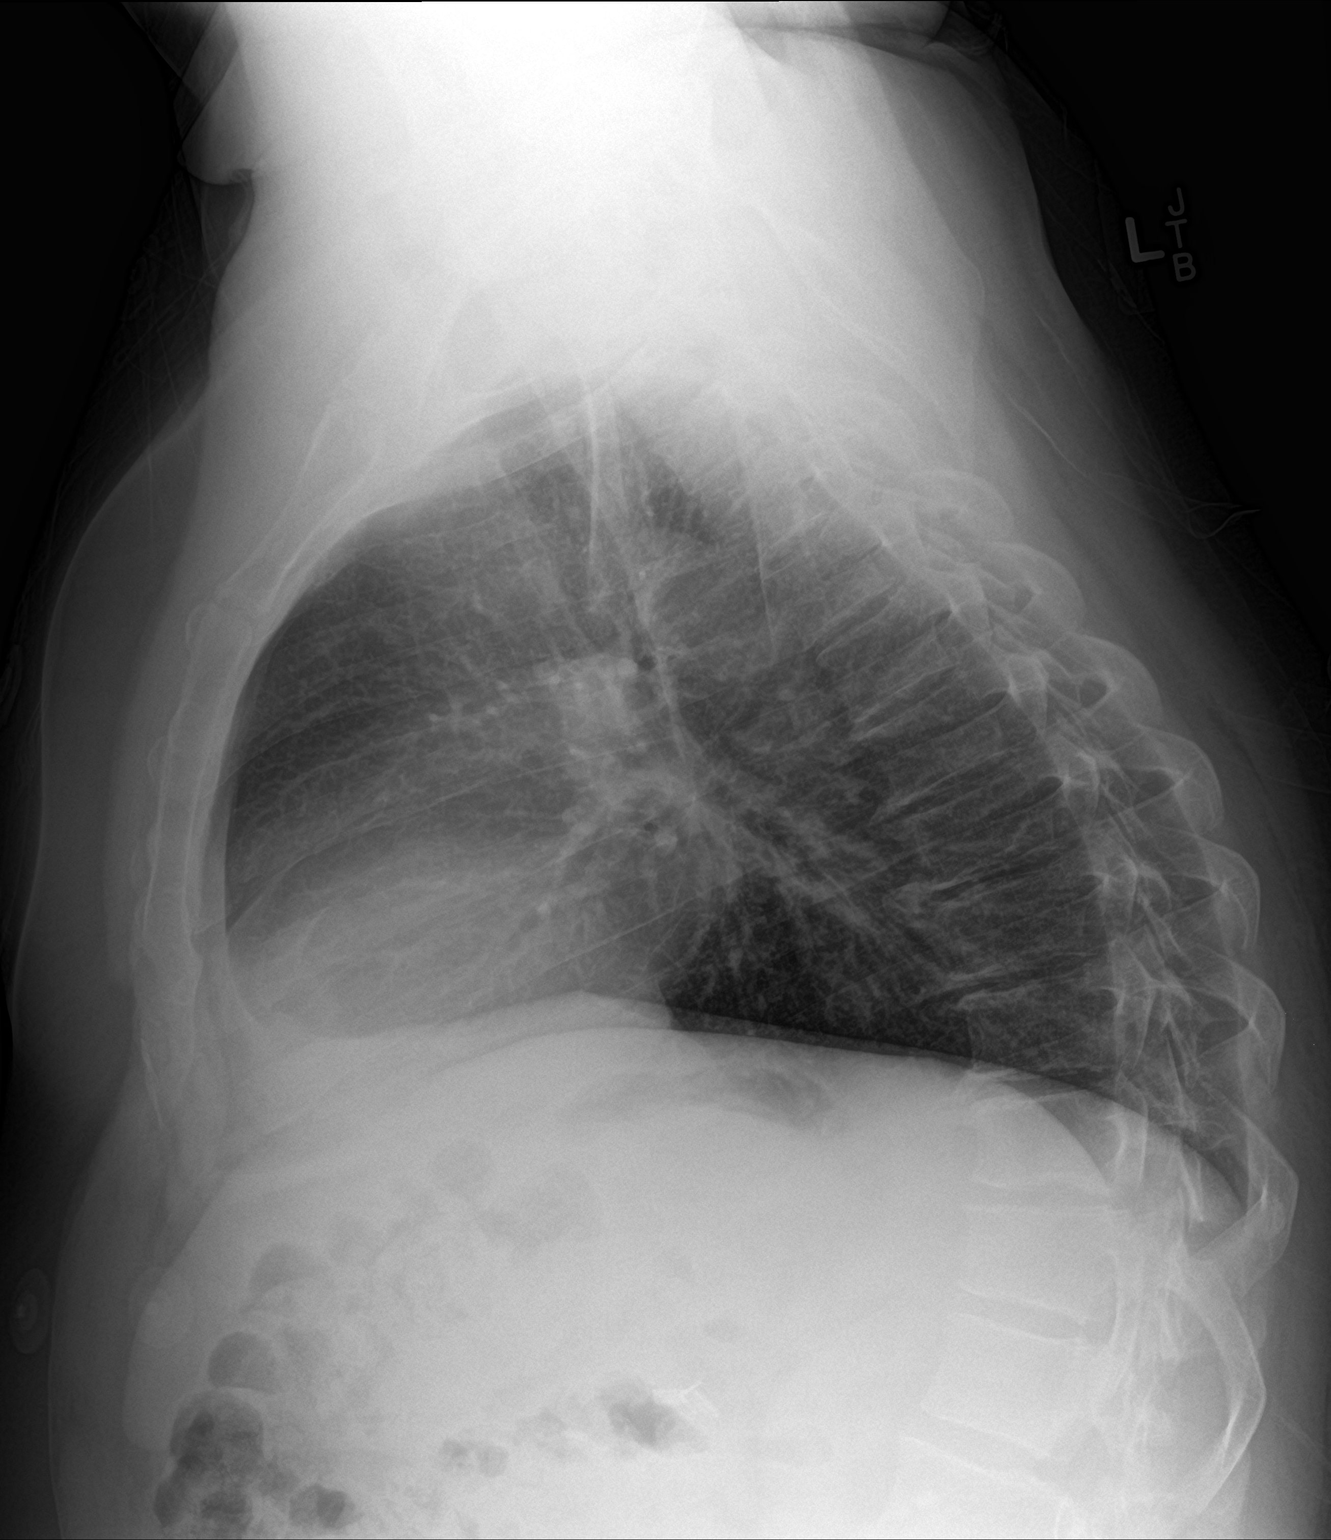

[2 of 2 positions shown; findings below may reference images not displayed]

FINDINGS: There is no edema or consolidation. The heart size and pulmonary
vascularity are normal. No adenopathy. There is degenerative change
in the thoracic spine.
IMPRESSION: No edema or consolidation.

## 2017-09-06 ENCOUNTER — Encounter (HOSPITAL_COMMUNITY): Payer: Self-pay | Admitting: Emergency Medicine

## 2017-09-06 DIAGNOSIS — R339 Retention of urine, unspecified: Secondary | ICD-10-CM | POA: Insufficient documentation

## 2017-09-06 DIAGNOSIS — Z5321 Procedure and treatment not carried out due to patient leaving prior to being seen by health care provider: Secondary | ICD-10-CM | POA: Insufficient documentation

## 2017-09-06 LAB — BASIC METABOLIC PANEL
Anion gap: 10 (ref 5–15)
BUN: 12 mg/dL (ref 6–20)
CALCIUM: 8.7 mg/dL — AB (ref 8.9–10.3)
CO2: 20 mmol/L — AB (ref 22–32)
Chloride: 104 mmol/L (ref 101–111)
Creatinine, Ser: 0.85 mg/dL (ref 0.61–1.24)
GFR calc Af Amer: >60 mL/min (ref >60)
GFR calc non Af Amer: >60 mL/min (ref >60)
GLUCOSE: 99 mg/dL (ref 65–99)
Potassium: 3.7 mmol/L (ref 3.5–5.1)
Sodium: 134 mmol/L — ABNORMAL LOW (ref 135–145)

## 2017-09-06 LAB — URINALYSIS, ROUTINE W REFLEX MICROSCOPIC
BILIRUBIN URINE: NEGATIVE
GLUCOSE, UA: NEGATIVE mg/dL
KETONES UR: 5 mg/dL — AB
Nitrite: NEGATIVE
PH: 5 (ref 5.0–8.0)
Protein, ur: 30 mg/dL — AB
Specific Gravity, Urine: 1.018 (ref 1.005–1.030)
Squamous Epithelial / HPF: NONE SEEN

## 2017-09-06 LAB — CBC
HCT: 48.5 % (ref 39.0–52.0)
HEMOGLOBIN: 16.6 g/dL (ref 13.0–17.0)
MCH: 31.4 pg (ref 26.0–34.0)
MCHC: 34.2 g/dL (ref 30.0–36.0)
MCV: 91.7 fL (ref 78.0–100.0)
PLATELETS: 331 10*3/uL (ref 150–400)
RBC: 5.29 MIL/uL (ref 4.22–5.81)
RDW: 13.6 % (ref 11.5–15.5)
WBC: 16.9 10*3/uL — ABNORMAL HIGH (ref 4.0–10.5)

## 2017-09-06 MED ORDER — OXYCODONE-ACETAMINOPHEN 5-325 MG PO TABS
1.0000 | ORAL_TABLET | ORAL | Status: DC | PRN
  Administered 2017-09-06: 1 via ORAL

## 2017-09-06 MED ORDER — OXYCODONE-ACETAMINOPHEN 5-325 MG PO TABS
ORAL_TABLET | ORAL | Status: AC
  Filled 2017-09-06: qty 1

## 2017-09-06 NOTE — ED Triage Notes (Addendum)
Reports being unable to urinate for the last couple of hours.  Notably uncomfortable in triage.  Hx of kidney stones.  Bladder scanned in triage.  Shows 20cc's.  Patient able to void 30cc's.  Repeat scan shows no urine.  Now reports pain in right flank.

## 2017-09-07 ENCOUNTER — Emergency Department (HOSPITAL_COMMUNITY)
Admission: EM | Admit: 2017-09-07 | Discharge: 2017-09-07 | Disposition: A | Discharge status: Home / Self Care | Payer: Self-pay | Attending: Emergency Medicine | Admitting: Emergency Medicine

## 2017-09-07 NOTE — ED Notes (Signed)
Patient appeared more comfortable since being medicated for pain.  Significant other at the desk wanting to know how much longer.  Explained estimated wait and number of patients ahead of him.  Left yelling we will never come back here again.

## 2018-08-10 ENCOUNTER — Encounter (HOSPITAL_COMMUNITY): Payer: Self-pay | Admitting: Emergency Medicine

## 2018-08-10 ENCOUNTER — Emergency Department (HOSPITAL_COMMUNITY)
Admission: EM | Admit: 2018-08-10 | Discharge: 2018-08-10 | Disposition: A | Discharge status: Home / Self Care | Payer: Self-pay | Attending: Emergency Medicine | Admitting: Emergency Medicine

## 2018-08-10 DIAGNOSIS — R1013 Epigastric pain: Secondary | ICD-10-CM | POA: Insufficient documentation

## 2018-08-10 DIAGNOSIS — R112 Nausea with vomiting, unspecified: Secondary | ICD-10-CM | POA: Insufficient documentation

## 2018-08-10 DIAGNOSIS — F17210 Nicotine dependence, cigarettes, uncomplicated: Secondary | ICD-10-CM | POA: Insufficient documentation

## 2018-08-10 LAB — CBC
HEMATOCRIT: 49.9 % (ref 39.0–52.0)
Hemoglobin: 17.3 g/dL — ABNORMAL HIGH (ref 13.0–17.0)
MCH: 32.8 pg (ref 26.0–34.0)
MCHC: 34.7 g/dL (ref 30.0–36.0)
MCV: 94.7 fL (ref 78.0–100.0)
Platelets: 335 10*3/uL (ref 150–400)
RBC: 5.27 MIL/uL (ref 4.22–5.81)
RDW: 13.4 % (ref 11.5–15.5)
WBC: 15.4 10*3/uL — ABNORMAL HIGH (ref 4.0–10.5)

## 2018-08-10 LAB — COMPREHENSIVE METABOLIC PANEL
ALBUMIN: 4.5 g/dL (ref 3.5–5.0)
ALT: 31 U/L (ref 0–44)
AST: 25 U/L (ref 15–41)
Alkaline Phosphatase: 95 U/L (ref 38–126)
Anion gap: 8 (ref 5–15)
BILIRUBIN TOTAL: 0.7 mg/dL (ref 0.3–1.2)
BUN: 15 mg/dL (ref 6–20)
CO2: 29 mmol/L (ref 22–32)
Calcium: 9.1 mg/dL (ref 8.9–10.3)
Chloride: 107 mmol/L (ref 98–111)
Creatinine, Ser: 0.62 mg/dL (ref 0.61–1.24)
GFR calc non Af Amer: >60 mL/min (ref >60)
GLUCOSE: 102 mg/dL — AB (ref 70–99)
POTASSIUM: 4.4 mmol/L (ref 3.5–5.1)
SODIUM: 144 mmol/L (ref 135–145)
TOTAL PROTEIN: 7.6 g/dL (ref 6.5–8.1)

## 2018-08-10 LAB — LIPASE, BLOOD: Lipase: 29 U/L (ref 11–51)

## 2018-08-10 MED ORDER — ONDANSETRON 4 MG PO TBDP
4.0000 mg | ORAL_TABLET | Freq: Once | ORAL | Status: AC | PRN
  Administered 2018-08-10: 4 mg via ORAL
  Filled 2018-08-10: qty 1

## 2018-08-10 MED ORDER — HALOPERIDOL LACTATE 5 MG/ML IJ SOLN
5.0000 mg | Freq: Once | INTRAMUSCULAR | Status: AC
  Administered 2018-08-10: 5 mg via INTRAVENOUS
  Filled 2018-08-10: qty 1

## 2018-08-10 MED ORDER — FAMOTIDINE IN NACL 20-0.9 MG/50ML-% IV SOLN
20.0000 mg | Freq: Two times a day (BID) | INTRAVENOUS | Status: DC
  Administered 2018-08-10: 20 mg via INTRAVENOUS
  Filled 2018-08-10: qty 50

## 2018-08-10 MED ORDER — ONDANSETRON 4 MG PO TBDP: 4.0000 mg | ORAL_TABLET | Freq: Three times a day (TID) | ORAL | 0 refills | Status: AC | PRN

## 2018-08-10 MED ORDER — PANTOPRAZOLE SODIUM 40 MG PO TBEC: 40.0000 mg | DELAYED_RELEASE_TABLET | Freq: Every day | ORAL | 0 refills | Status: AC

## 2018-08-10 NOTE — Discharge Instructions (Signed)
Thank you for allowing me to care for you today in the Emergency Department.   Let 1 tablet of Zofran dissolve under your tongue every 8 hours as needed for nausea or vomiting.  When you first start drinking liquids, take small, frequent sips instead of drinking a large amount of liquid at one time because this may cause you to vomit again.  Take 1 tablet of Protonix daily for the next 14 days.  This can help to coat your stomach since you are having a small amount of blood in her vomit.  Take thousand milligrams of Tylenol every 8 hours for pain control.  Avoid Goody powder, BC powder, NSAIDs, ibuprofen, Advil, or Aleve for pain control.  If you develop dark black stools or continue to have a small amount of blood if you vomit, please follow-up with gastroenterology.  Their information is located above.  Return to the emergency department if you develop a large amount of blood in her vomit, if you continue to vomit despite taking Zofran, develop a high fever, or other new, concerning symptoms.

## 2018-08-10 NOTE — ED Triage Notes (Signed)
Pt reports SOB, n/v/d and upper abd pains since yesterday.

## 2018-08-10 NOTE — ED Provider Notes (Signed)
[facility]-EMERGENCY DEPT Provider Note   CSN: [id number] Arrival date & time: 08/10/18  1235     History   Chief Complaint Chief Complaint  Patient presents with  . Shortness of Breath  . Emesis  . Epistaxis    HPI [name] Ramsey is a 42 y.o. male a history of gastric ulcer and hiatal hernia who presents to the emergency department with a chief complaint of epigastric pain, onset yesterday.  The patient endorses sudden onset, constant, worsening, nonradiating epigastric pain with associated nausea and vomiting.  Pain is worse with laying flat.  No other aggravating or alleviating factors.  He states he has had approximately 10-20 episodes of nonbilious emesis since onset of symptoms, but he has noticed a small amount of bright red blood the last few times he has not vomited.  He states that he feels short of breath and diaphoretic, but only when he has vomiting.  He denies fever, chills, back pain, dysuria, hematuria, epistaxis, constipation, diarrhea, chest pain, penile or testicular pain or swelling, or penile discharge.  Surgical history includes cholecystectomy.  He reports that he was diagnosed with a "stomach ulcer" many years ago and cannot recall how it was treated.  He does not follow with GI.  He also has a known hiatal hernia.  Denies any chronic medical problems and does not take any medications on a daily basis.  He is a current, every day smoker, approximately 1 pack/day.  Reports alcohol use of 2-3 beers 2-3 times per week.  He reports he did have a couple of alcoholic beverages that described as a "concoction of liquor and beer", which was new for him.  He reports minimal NSAID use.  Reports he took approximately 6 Goody powders over the course of a week about 1 month ago for dental pain.  He reports occasional marijuana use.  No IV or recreational drug use.  No known sick contacts.  No recent camping or travel.  The history is provided by the  patient. No language interpreter was used.    Past Medical History:  Diagnosis Date  . Gastric ulcer     There are no active problems to display for this patient.   Past Surgical History:  Procedure Laterality Date  . CHOLECYSTECTOMY    . FRACTURE SURGERY    . ulcers          Home Medications    Prior to Admission medications   Medication Sig Start Date End Date Taking? Authorizing Provider  Aspirin-Acetaminophen (GOODYS BODY PAIN PO) Take 3-4 packets by mouth daily as needed (toothache).   Yes [provider]  ondansetron (ZOFRAN ODT) 4 MG disintegrating tablet Take 1 tablet (4 mg total) by mouth every 8 (eight) hours as needed for nausea or vomiting. 08/10/18   Jahlil Ziller A, PA-C  pantoprazole (PROTONIX) 40 MG tablet Take 1 tablet (40 mg total) by mouth daily for 14 days. 08/10/18 08/24/18  Zakir Henner, Pedro Earls A, PA-C    Family History No family history on file.  Social History Social History   Tobacco Use  . Smoking status: Current Every Day Smoker    Types: Cigarettes  . Smokeless tobacco: Never Used  Substance Use Topics  . Alcohol use: Yes    Comment: occasional   . Drug use: No     Allergies   Aspirin   Review of Systems Review of Systems  Constitutional: Negative for appetite change, chills, diaphoresis and fever.  Respiratory: Positive for shortness  of breath.   Cardiovascular: Negative for chest pain.  Gastrointestinal: Positive for abdominal pain, nausea and vomiting. Negative for anal bleeding, constipation and diarrhea.  Genitourinary: Negative for dysuria, flank pain, hematuria, penile pain, penile swelling, scrotal swelling and urgency.  Musculoskeletal: Negative for back pain and neck pain.  Skin: Negative for rash.  Allergic/Immunologic: Negative for immunocompromised state.  Neurological: Negative for weakness and headaches.  Psychiatric/Behavioral: Negative for confusion.     Physical Exam Updated Vital Signs BP 130/72   Pulse  72   Temp 98 F (36.7 C) (Oral)   Resp 18   Ht 5\' 5"  (1.651 m)   Wt 68 kg   SpO2 96%   BMI 24.96 kg/m   Physical Exam  Constitutional: He appears well-developed. No distress.  HENT:  Head: Normocephalic.  Eyes: Conjunctivae are normal.  Neck: Neck supple.  Cardiovascular: Normal rate, regular rhythm, normal heart sounds and intact distal pulses. Exam reveals no gallop and no friction rub.  No murmur heard. Pulmonary/Chest: Effort normal and breath sounds normal. No stridor. No respiratory distress. He has no wheezes. He has no rales. He exhibits no tenderness.  Abdominal: Soft. Bowel sounds are normal. He exhibits no distension and no mass. There is tenderness. There is no rebound and no guarding. No hernia.  Well localized, reproducible tenderness to the epigastric region without rebound or guarding.  No other tenderness throughout the abdomen.  No CVA tenderness bilaterally.  Neurological: He is alert.  Skin: Skin is warm and dry. He is not diaphoretic.  Psychiatric: His behavior is normal.  Nursing note and vitals reviewed.  ED Treatments / Results  Labs (all labs ordered are listed, but only abnormal results are displayed) Labs Reviewed  COMPREHENSIVE METABOLIC PANEL - Abnormal; Notable for the following components:      Result Value   Glucose, Bld 102 (*)    All other components within normal limits  CBC - Abnormal; Notable for the following components:   WBC 15.4 (*)    Hemoglobin 17.3 (*)    All other components within normal limits  LIPASE, BLOOD  URINALYSIS, ROUTINE W REFLEX MICROSCOPIC    EKG None  Radiology No results found.  Procedures Procedures (including critical care time)  Medications Ordered in ED Medications  famotidine (PEPCID) IVPB 20 mg premix (20 mg Intravenous New Bag/Given 08/10/18 1537)  ondansetron (ZOFRAN-ODT) disintegrating tablet 4 mg (4 mg Oral Given 08/10/18 1243)  haloperidol lactate (HALDOL) injection 5 mg (5 mg Intravenous Given  08/10/18 1537)     Initial Impression / Assessment and Plan / ED Course  I have reviewed the triage vital signs and the nursing notes.  Pertinent labs & imaging results that were available during my care of the patient were reviewed by me and considered in my medical decision making (see chart for details).      42 year old male with a history of gastric ulcers and hiatal hernia presenting with epigastric pain, nausea, and vomiting since yesterday.  He has also had a small amount of hematemesis the last times he vomits that he describes as "yellow mixed with a small amount of bright red blood".  Suspect Mallory-Weiss tear as the patient has not had an active gastric ulcer in many years and only moderate risk factors.  Haldol and Pepcid given in the ED.  Labs are reviewed and are reassuring.  Mild leukocytosis of 15.4 and hemoglobin of 17.3, likely in the setting of hemoconcentration from vomiting.  On reexamination, the patient reports his  pain has resolved.  He has had no additional episodes of vomiting and has been successfully fluid challenge.  He states that he is feeling much better and is ready to go.  No further episodes of shortness of breath or diaphoresis.  Will discharge the patient to home with Zofran, Protonix, and Tylenol.  Will provide the patient with a referral to GI if he develops melena or hematochezia.  Doubt perforated ulcer, pancreatitis, diverticulitis, ACS, appendicitis, or testicular torsion.  Strict return precautions given.  He is hemodynamically stable and in no acute distress.  He is safe for discharge home with outpatient follow-up at this time.   Final Clinical Impressions(s) / ED Diagnoses   Final diagnoses:  Epigastric pain  Non-intractable vomiting with nausea, unspecified vomiting type    ED Discharge Orders         Ordered    pantoprazole (PROTONIX) 40 MG tablet  Daily     08/10/18 1709    ondansetron (ZOFRAN ODT) 4 MG disintegrating tablet  Every 8  hours PRN     08/10/18 1709           Kymani Shimabukuro A, PA-C 08/10/18 1718    Mancel BaleWentz, Elliott, MD 08/11/18 1053    Mancel BaleWentz, Elliott, MD 08/11/18 1055

## 2018-08-10 NOTE — ED Notes (Signed)
Pt states he had a return of severe chest pain.  Another EKG was taken.

## 2018-08-10 NOTE — ED Notes (Signed)
Pts family out at nurses station. Pts family stating " We need help in here he is throwing up and no one is helping him we need to go to a room" Pt and family informed that we are awaiting a room to put him in. We are working on it. Pt vomiting in trash can. Pt provided with emesis bag and wet cloth.  Pt has already received Zofran for nausea. Pt sat in floor by trash can and put emesis bag in chair. Will continue to monitor.

## 2019-10-02 ENCOUNTER — Ambulatory Visit (HOSPITAL_COMMUNITY)
Admission: EM | Admit: 2019-10-02 | Discharge: 2019-10-02 | Disposition: A | Discharge status: Home / Self Care | Payer: Self-pay | Attending: Family Medicine | Admitting: Family Medicine

## 2019-10-02 ENCOUNTER — Other Ambulatory Visit: Payer: Self-pay

## 2019-10-02 ENCOUNTER — Encounter (HOSPITAL_COMMUNITY): Payer: Self-pay | Admitting: Emergency Medicine

## 2019-10-02 ENCOUNTER — Ambulatory Visit (INDEPENDENT_AMBULATORY_CARE_PROVIDER_SITE_OTHER): Payer: Self-pay

## 2019-10-02 DIAGNOSIS — L089 Local infection of the skin and subcutaneous tissue, unspecified: Secondary | ICD-10-CM | POA: Insufficient documentation

## 2019-10-02 LAB — CBC WITH DIFFERENTIAL/PLATELET
Abs Immature Granulocytes: 0.08 10*3/uL — ABNORMAL HIGH (ref 0.00–0.07)
Basophils Absolute: 0.1 10*3/uL (ref 0.0–0.1)
Basophils Relative: 1 %
Eosinophils Absolute: 0.1 10*3/uL (ref 0.0–0.5)
Eosinophils Relative: 1 %
HCT: 51.1 % (ref 39.0–52.0)
Hemoglobin: 17.8 g/dL — ABNORMAL HIGH (ref 13.0–17.0)
Immature Granulocytes: 1 %
Lymphocytes Relative: 17 %
Lymphs Abs: 2.5 10*3/uL (ref 0.7–4.0)
MCH: 32 pg (ref 26.0–34.0)
MCHC: 34.8 g/dL (ref 30.0–36.0)
MCV: 91.9 fL (ref 80.0–100.0)
Monocytes Absolute: 1.3 10*3/uL — ABNORMAL HIGH (ref 0.1–1.0)
Monocytes Relative: 9 %
Neutro Abs: 10.5 10*3/uL — ABNORMAL HIGH (ref 1.7–7.7)
Neutrophils Relative %: 71 %
Platelets: 360 10*3/uL (ref 150–400)
RBC: 5.56 MIL/uL (ref 4.22–5.81)
RDW: 12.9 % (ref 11.5–15.5)
WBC: 14.5 10*3/uL — ABNORMAL HIGH (ref 4.0–10.5)
nRBC: 0 % (ref 0.0–0.2)

## 2019-10-02 MED ORDER — CEFTRIAXONE SODIUM 1 G IJ SOLR
1.0000 g | Freq: Once | INTRAMUSCULAR | Status: AC
  Administered 2019-10-02: 19:00:00 1 g via INTRAMUSCULAR

## 2019-10-02 MED ORDER — ACETAMINOPHEN 325 MG PO TABS
ORAL_TABLET | ORAL | Status: AC
  Filled 2019-10-02: qty 2

## 2019-10-02 MED ORDER — ACETAMINOPHEN 325 MG PO TABS
650.0000 mg | ORAL_TABLET | Freq: Once | ORAL | Status: AC
  Administered 2019-10-02: 18:00:00 650 mg via ORAL

## 2019-10-02 MED ORDER — CEFTRIAXONE SODIUM 1 G IJ SOLR
INTRAMUSCULAR | Status: AC
Start: 2019-10-02 — End: ?
  Filled 2019-10-02: qty 10

## 2019-10-02 MED ORDER — DOXYCYCLINE HYCLATE 100 MG PO TABS: 100.0000 mg | ORAL_TABLET | Freq: Two times a day (BID) | ORAL | 0 refills | Status: AC

## 2019-10-02 NOTE — ED Provider Notes (Signed)
  [facility]    CSN: [id number] Arrival date & time: 10/02/19  1615  Chief Complaint  Patient presents with  . Wound Check    [name] Ramsey is a 43 y.o. male here for a skin complaint.  Duration: 3 weeks Location: 3rd and 4th digit Pruritic? No Painful? Yes Drainage? No New soaps/lotions/topicals/detergents? No Sick contacts? No Other associated symptoms: pinkish skin around fingertips Therapies tried thus far: soaks  ROS:  Const: No fevers Skin: As noted in HPI  Past Medical History:  Diagnosis Date  . Gastric ulcer     BP (!) 146/92 (BP Location: Left Arm)   Pulse 96   Temp 97.8 F (36.6 C) (Temporal)   Resp 18   SpO2 96%  Gen: awake, alert, appearing stated age Lungs: No accessory muscle use Skin: 2 patches of eschar on the lateral tips of 3rd and 4th digit on R. There is some blanching and a pinkish hue compared to surrounding tissue. It is extremely tender to palpation.  MSK: Decent flexion of finger without  Psych: Age appropriate judgment and insight   Final Clinical Impressions(s) / UC Diagnoses   Final diagnoses:  Skin infection   Rocephin while here.  We did discuss going to the emergency department, but he would prefer to try outpatient therapy first.  He knows if he has worsening pain or fails to improve, to go to the ER.  I do not suspect any flexor tenosynovitis, abscess, x-ray is reassuring and he does not have many risk factors for osteomyelitis.   Discharge Instructions     If the pain does not improve or worsens or if there is spreading skin color changes, go to the ER. Fevers or drainage should also prompt seeking care in ER.   Ice/cold pack over area for 10-15 min twice daily.  Continue to soak for 10-15 min twice daily.  Keep clean and dry.      ED Prescriptions    Medication Sig Dispense Auth. Provider   doxycycline (VIBRA-TABS) 100 MG tablet Take 1 tablet (100 mg total) by mouth 2 (two) times daily. 20 tablet  Shelda Pal, DO       Riki Sheer [place], [place] 10/02/19 [phone]

## 2019-10-02 NOTE — Discharge Instructions (Addendum)
If the pain does not improve or worsens or if there is spreading skin color changes, go to the ER. Fevers or drainage should also prompt seeking care in ER.   Ice/cold pack over area for 10-15 min twice daily.  Continue to soak for 10-15 min twice daily.  Keep clean and dry.

## 2019-10-02 NOTE — ED Triage Notes (Signed)
Pt sts wound infection to tips of index and middle left fingers after getting grinding dust in them

## 2020-04-29 IMAGING — DX DG HAND COMPLETE 3+V*L*
3 series · 3 of 3 positions shown · non-contrast
Comparison: None.

CLINICAL DATA: Third and fourth distal phalanx pain, infection

EXAM:
LEFT HAND - COMPLETE 3+ VIEW

[hand pa]
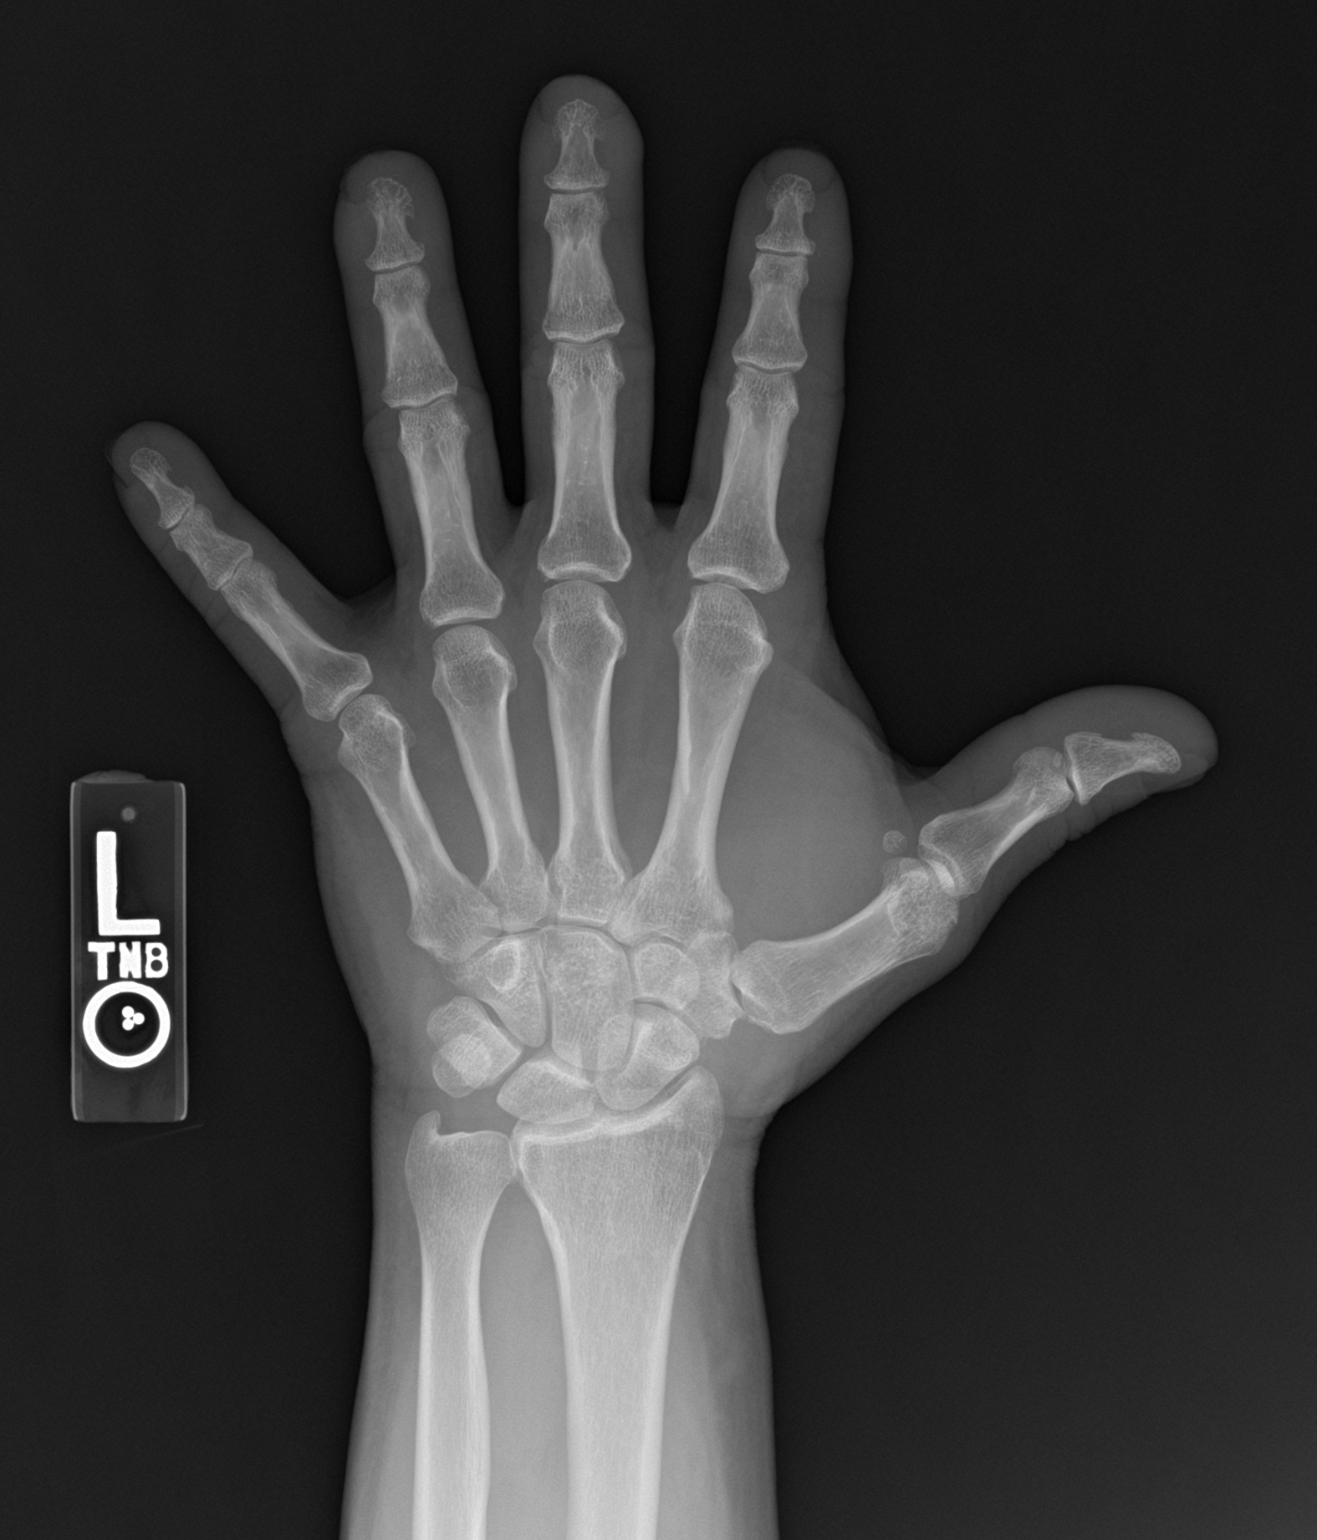

[hand obl]
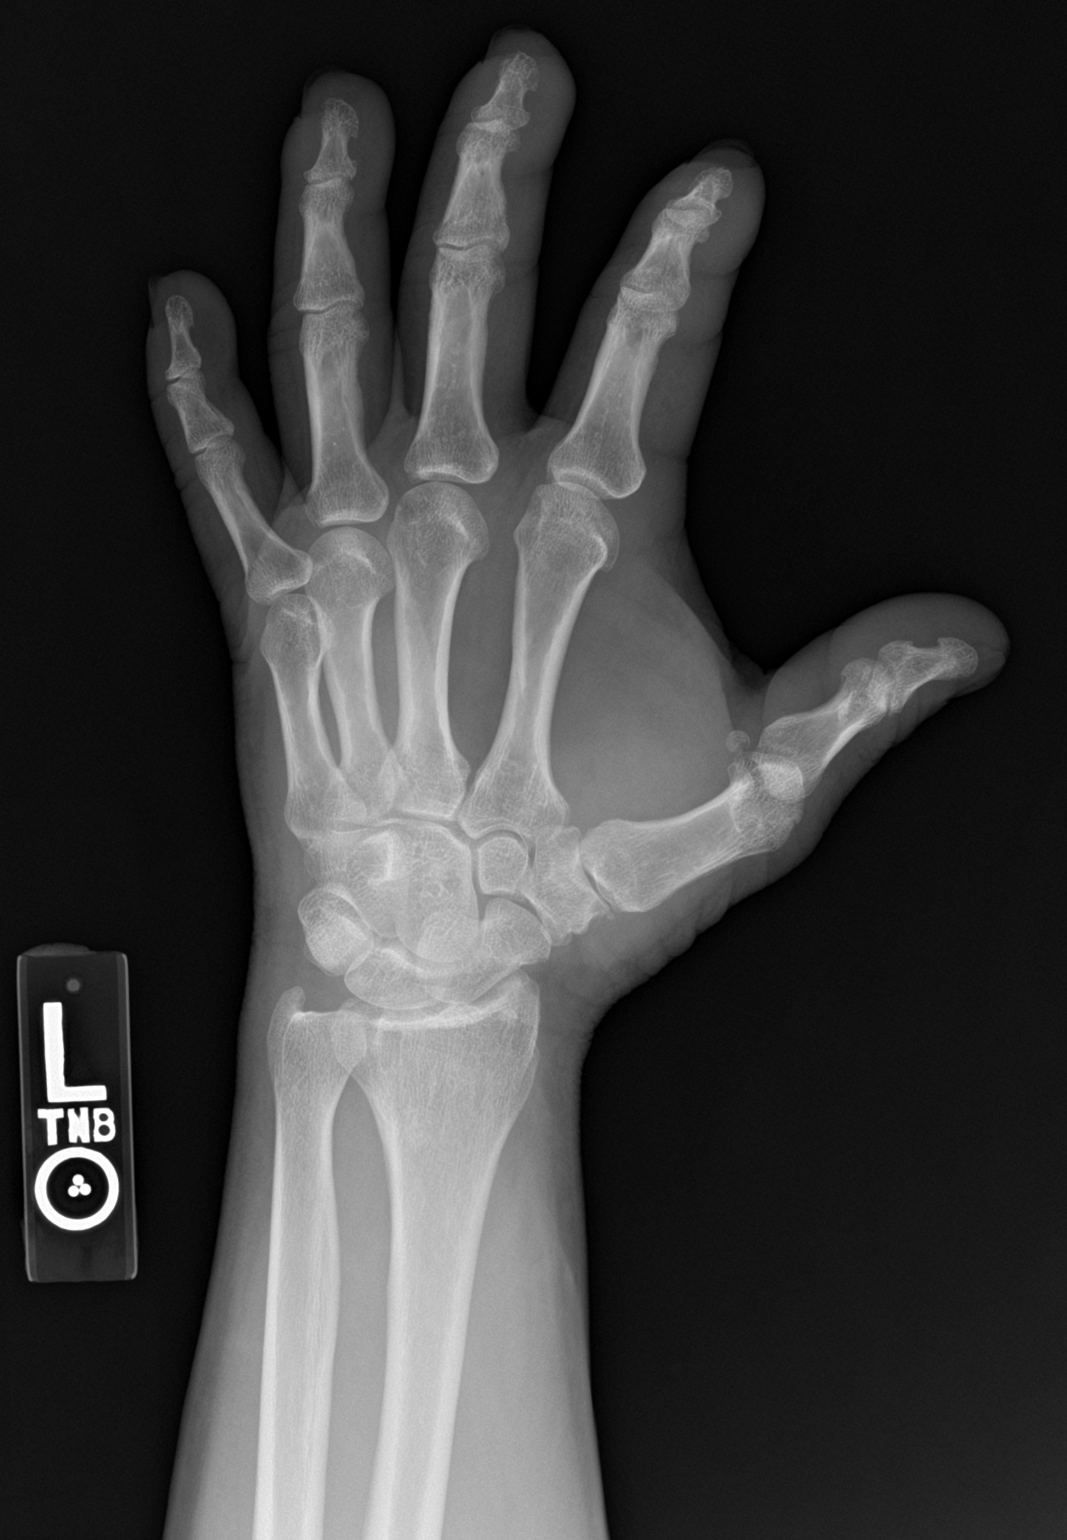

[hand lat]
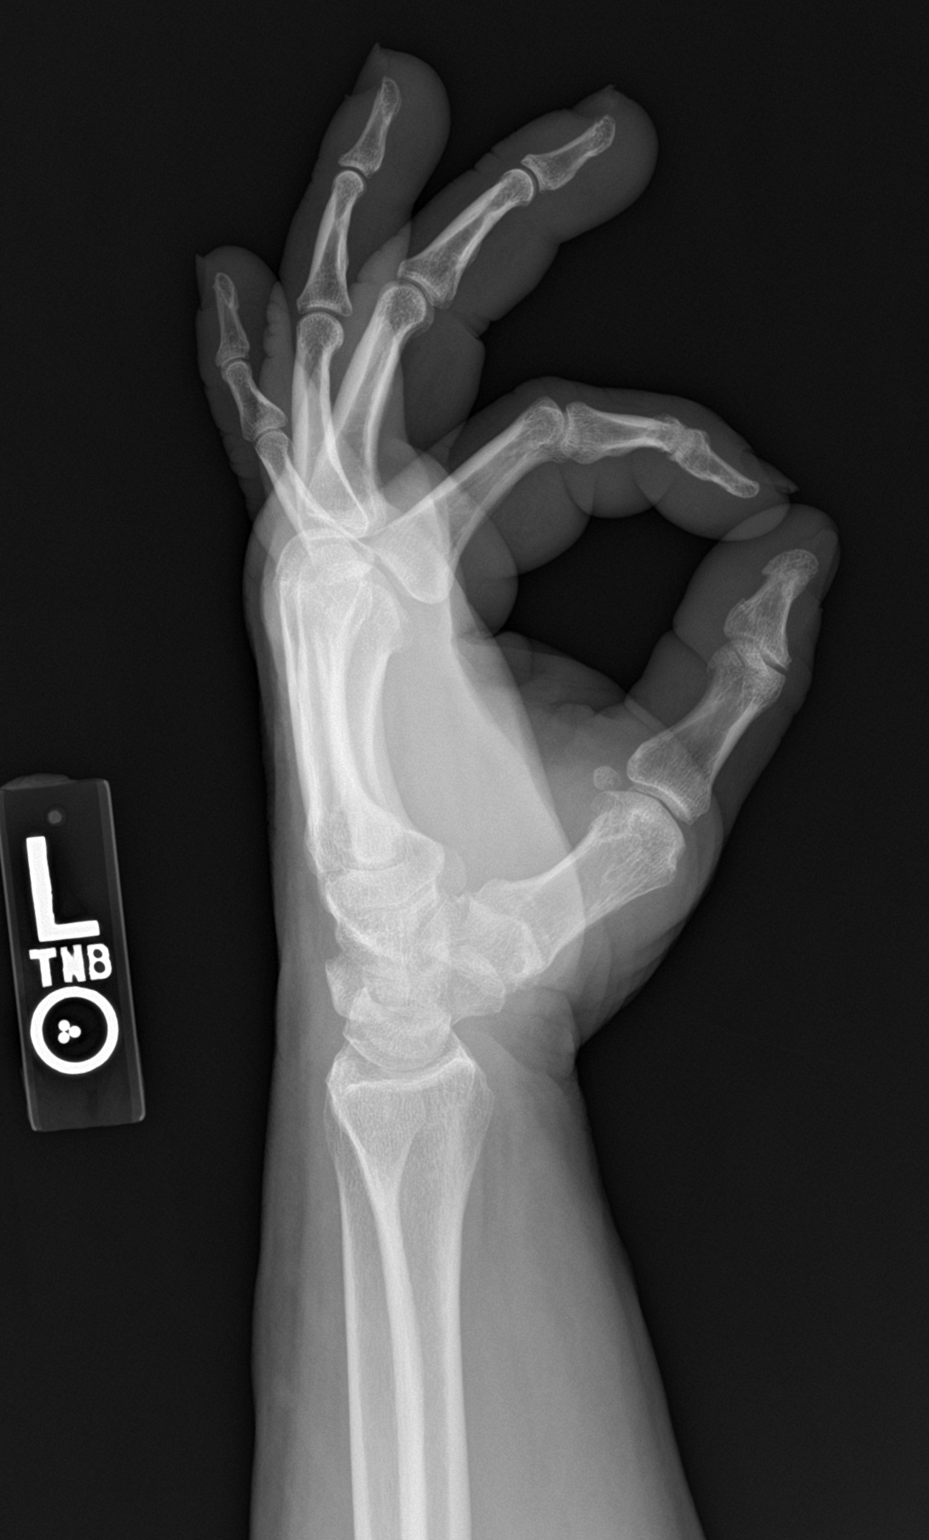

[3 of 3 positions shown; findings below may reference images not displayed]

FINDINGS: No fracture or dislocation of the left hand. Mild arthrosis of the
left thumb carpometacarpal joint. Joint spaces are otherwise
preserved. Soft tissue edema about the distal digits, particularly
the third and fourth. No radiopaque foreign body noted. No
radiographic evidence of bony erosion or sclerosis.
IMPRESSION: No fracture or or dislocation of the left hand. Soft tissue edema
about the distal digits, particularly the third and fourth. No
radiopaque foreign body noted. No radiographic evidence of bony
erosion or sclerosis.

## 2023-08-04 DIAGNOSIS — Z227 Latent tuberculosis: Secondary | ICD-10-CM

## 2023-08-04 DIAGNOSIS — R7611 Nonspecific reaction to tuberculin skin test without active tuberculosis: Secondary | ICD-10-CM | POA: Insufficient documentation

## 2024-01-30 ENCOUNTER — Emergency Department (HOSPITAL_COMMUNITY)
Admission: EM | Admit: 2024-01-30 | Discharge: 2024-01-31 | Disposition: A | Discharge status: Home / Self Care | Payer: Medicaid Other | Attending: Emergency Medicine | Admitting: Emergency Medicine

## 2024-01-30 ENCOUNTER — Other Ambulatory Visit: Payer: Self-pay

## 2024-01-30 ENCOUNTER — Encounter (HOSPITAL_COMMUNITY): Payer: Self-pay

## 2024-01-30 DIAGNOSIS — Z720 Tobacco use: Secondary | ICD-10-CM | POA: Insufficient documentation

## 2024-01-30 DIAGNOSIS — J069 Acute upper respiratory infection, unspecified: Secondary | ICD-10-CM | POA: Insufficient documentation

## 2024-01-30 DIAGNOSIS — Z20822 Contact with and (suspected) exposure to covid-19: Secondary | ICD-10-CM | POA: Insufficient documentation

## 2024-01-30 DIAGNOSIS — R079 Chest pain, unspecified: Secondary | ICD-10-CM | POA: Diagnosis present

## 2024-01-30 LAB — CBC WITH DIFFERENTIAL/PLATELET
Abs Immature Granulocytes: 0.05 10*3/uL (ref 0.00–0.07)
Basophils Absolute: 0.1 10*3/uL (ref 0.0–0.1)
Basophils Relative: 1 %
Eosinophils Absolute: 0.7 10*3/uL — ABNORMAL HIGH (ref 0.0–0.5)
Eosinophils Relative: 5 %
HCT: 52.3 % — ABNORMAL HIGH (ref 39.0–52.0)
Hemoglobin: 17.6 g/dL — ABNORMAL HIGH (ref 13.0–17.0)
Immature Granulocytes: 0 %
Lymphocytes Relative: 13 %
Lymphs Abs: 1.7 10*3/uL (ref 0.7–4.0)
MCH: 31.9 pg (ref 26.0–34.0)
MCHC: 33.7 g/dL (ref 30.0–36.0)
MCV: 94.7 fL (ref 80.0–100.0)
Monocytes Absolute: 0.9 10*3/uL (ref 0.1–1.0)
Monocytes Relative: 7 %
Neutro Abs: 9.9 10*3/uL — ABNORMAL HIGH (ref 1.7–7.7)
Neutrophils Relative %: 74 %
Platelets: 319 10*3/uL (ref 150–400)
RBC: 5.52 MIL/uL (ref 4.22–5.81)
RDW: 13.3 % (ref 11.5–15.5)
WBC: 13.4 10*3/uL — ABNORMAL HIGH (ref 4.0–10.5)
nRBC: 0 % (ref 0.0–0.2)

## 2024-01-30 LAB — COMPREHENSIVE METABOLIC PANEL
ALT: 51 U/L — ABNORMAL HIGH (ref 0–44)
AST: 28 U/L (ref 15–41)
Albumin: 4.1 g/dL (ref 3.5–5.0)
Alkaline Phosphatase: 84 U/L (ref 38–126)
Anion gap: 12 (ref 5–15)
BUN: 15 mg/dL (ref 6–20)
CO2: 21 mmol/L — ABNORMAL LOW (ref 22–32)
Calcium: 9.6 mg/dL (ref 8.9–10.3)
Chloride: 107 mmol/L (ref 98–111)
Creatinine, Ser: 0.5 mg/dL — ABNORMAL LOW (ref 0.61–1.24)
GFR, Estimated: >60 mL/min (ref >60)
Glucose, Bld: 84 mg/dL (ref 70–99)
Potassium: 4 mmol/L (ref 3.5–5.1)
Sodium: 140 mmol/L (ref 135–145)
Total Bilirubin: 0.9 mg/dL (ref 0.0–1.2)
Total Protein: 7.4 g/dL (ref 6.5–8.1)

## 2024-01-30 LAB — TROPONIN I (HIGH SENSITIVITY): Troponin I (High Sensitivity): <2 ng/L (ref <18)

## 2024-01-30 NOTE — ED Provider Triage Note (Signed)
Emergency Medicine Provider Triage Evaluation Note  [name] Ramsey , a 48 y.o. male  was evaluated in triage.  Pt complains of feeling unwell. CP, SOB, cough, fever. No le swelling. Some fatigue, congestion, rhinorrhea  Review of Systems  Positive: Cough, fever Negative:   Physical Exam  BP (!) 140/97 (BP Location: Left Arm)   Pulse (!) 102   Temp 98.4 F (36.9 C) (Oral)   Resp 18   Ht 5\' 5"  (1.651 m)   Wt 72.6 kg   SpO2 95%   BMI 26.63 kg/m  Gen:   Awake, no distress   Resp:  Normal effort  MSK:   Moves extremities without difficulty  Other:    Medical Decision Making  Medically screening exam initiated at 6:25 PM.  Appropriate orders placed.  [name] D Salim was informed that the remainder of the evaluation will be completed by another provider, this initial triage assessment does not replace that evaluation, and the importance of remaining in the ED until their evaluation is complete.  Cough,  cp fever   Tymeshia Awan A, PA-C 01/30/24 1827

## 2024-01-30 NOTE — ED Triage Notes (Signed)
Patient reports chest pain, fever, SOB, cough, congestion, and fatigue x 1 week. Took cold medications without relief.

## 2024-01-31 LAB — RESP PANEL BY RT-PCR (RSV, FLU A&B, COVID)  RVPGX2
Influenza A by PCR: NEGATIVE
Influenza B by PCR: NEGATIVE
Resp Syncytial Virus by PCR: NEGATIVE
SARS Coronavirus 2 by RT PCR: NEGATIVE

## 2024-01-31 LAB — TROPONIN I (HIGH SENSITIVITY): Troponin I (High Sensitivity): 3 ng/L (ref <18)

## 2024-01-31 MED ORDER — AZITHROMYCIN 250 MG PO TABS: 250.0000 mg | ORAL_TABLET | Freq: Every day | ORAL | 0 refills | Status: AC

## 2024-01-31 NOTE — Discharge Instructions (Signed)
As discussed, you most likely have an upper respiratory infection that is causing your symptoms.  You can alternate between ibuprofen and Tylenol every 4 hours as needed for body aches and chest tightness with coughing.  You can take medications like Mucinex or Sudafed over-the-counter to help with nasal congestion.  Get help right away if: You have shortness of breath that gets worse. You have very bad or constant: Headache. Ear pain. Pain in your forehead, behind your eyes, and over your cheekbones (sinus pain). Chest pain. You have long-lasting (chronic) lung disease along with any of these: Making high-pitched whistling sounds when you breathe, most often when you breathe out (wheezing). Long-lasting cough (more than 14 days). Coughing up blood. A change in your usual mucus. You have a stiff neck. You have changes in your: Vision. Hearing. Thinking. Mood.

## 2024-01-31 NOTE — ED Provider Notes (Signed)
[place] EMERGENCY DEPARTMENT AT [facility] Provider Note   CSN: [id number] Arrival date & time: 01/30/24  1752     History  Chief Complaint  Patient presents with   Chest Pain    [name] Ramsey is a 48 y.o. male with no significant past medical history presents the ED today for cough and congestion x 1 week.  Patient endorses chest tightening sensation when he coughs with shortness of breath as well as subjective fevers, body aches, and congestion.  He was taking NyQuil until he ran out, with improvement of symptoms.  Denies nausea, vomiting, diarrhea.  No additional complaints or concerns at this time.    Home Medications Prior to Admission medications   Medication Sig Start Date End Date Taking? Authorizing Provider  azithromycin (ZITHROMAX) 250 MG tablet Take 1 tablet (250 mg total) by mouth daily for 5 days. Take first 2 tablets together, then 1 every day until finished. 01/31/24 02/05/24 Yes Maxwell Marion, PA-C  Aspirin-Acetaminophen (GOODYS BODY PAIN PO) Take 3-4 packets by mouth daily as needed (toothache).    [provider]  doxycycline (VIBRA-TABS) 100 MG tablet Take 1 tablet (100 mg total) by mouth 2 (two) times daily. 10/02/19   Sharlene Dory, DO  ondansetron (ZOFRAN ODT) 4 MG disintegrating tablet Take 1 tablet (4 mg total) by mouth every 8 (eight) hours as needed for nausea or vomiting. 08/10/18   McDonald, Mia A, PA-C  pantoprazole (PROTONIX) 40 MG tablet Take 1 tablet (40 mg total) by mouth daily for 14 days. 08/10/18 08/24/18  McDonald, Mia A, PA-C      Allergies    Aspirin    Review of Systems   Review of Systems  Respiratory:  Positive for cough.   All other systems reviewed and are negative.   Physical Exam Updated Vital Signs BP (!) 129/91 (BP Location: Right Arm)   Pulse 70   Temp 97.7 F (36.5 C) (Oral)   Resp 16   Ht 5\' 5"  (1.651 m)   Wt 72.6 kg   SpO2 95%   BMI 26.63 kg/m  Physical Exam Vitals and nursing note  reviewed.  Constitutional:      General: He is not in acute distress.    Appearance: Normal appearance.  HENT:     Head: Normocephalic and atraumatic.     Mouth/Throat:     Mouth: Mucous membranes are moist.  Eyes:     Conjunctiva/sclera: Conjunctivae normal.     Pupils: Pupils are equal, round, and reactive to light.  Cardiovascular:     Rate and Rhythm: Normal rate and regular rhythm.     Pulses: Normal pulses.     Heart sounds: Normal heart sounds.  Pulmonary:     Effort: Pulmonary effort is normal.     Breath sounds: Normal breath sounds.  Abdominal:     Palpations: Abdomen is soft.     Tenderness: There is no abdominal tenderness.  Musculoskeletal:        General: Normal range of motion.     Cervical back: Normal range of motion.  Skin:    General: Skin is warm and dry.     Findings: No rash.  Neurological:     General: No focal deficit present.     Mental Status: He is alert.  Psychiatric:        Mood and Affect: Mood normal.        Behavior: Behavior normal.    ED Results / Procedures / Treatments  Labs (all labs ordered are listed, but only abnormal results are displayed) Labs Reviewed  CBC WITH DIFFERENTIAL/PLATELET - Abnormal; Notable for the following components:      Result Value   WBC 13.4 (*)    Hemoglobin 17.6 (*)    HCT 52.3 (*)    Neutro Abs 9.9 (*)    Eosinophils Absolute 0.7 (*)    All other components within normal limits  COMPREHENSIVE METABOLIC PANEL - Abnormal; Notable for the following components:   CO2 21 (*)    Creatinine, Ser 0.50 (*)    ALT 51 (*)    All other components within normal limits  RESP PANEL BY RT-PCR (RSV, FLU A&B, COVID)  RVPGX2  TROPONIN I (HIGH SENSITIVITY)  TROPONIN I (HIGH SENSITIVITY)    EKG EKG Interpretation Date/Time:  Friday January 30 2024 18:14:33 EST Ventricular Rate:  105 PR Interval:  116 QRS Duration:  97 QT Interval:  327 QTC Calculation: 433 R Axis:   14  Text Interpretation: Sinus  tachycardia Baseline wander in lead(s) V1 V3 V5 V6 Confirmed by Kennis Carina [phone]) on 01/30/2024 11:03:10 PM  Radiology No results found.  Procedures Procedures: not indicated.   Medications Ordered in ED Medications - No data to display  ED Course/ Medical Decision Making/ A&P                                 Medical Decision Making  This patient presents to the ED for concern of cough, this involves an extensive number of treatment options, and is a complaint that carries with it a high risk of complications and morbidity.   Differential diagnosis includes: Flu, COVID, RSV, other viral illness, ACS, etc. PERC negative - low suspicion for pulmonary embolism.   Comorbidities  No significant past medical history   Additional History  Additional history obtained from prior records.   Cardiac Monitoring / EKG  The patient was maintained on a cardiac monitor.  I personally viewed and interpreted the cardiac monitored which showed: sinus tachycardia with a heart rate of 105 bpm.   Lab Tests  I ordered and personally interpreted labs.  The pertinent results include:   Negative respiratory panel Negative troponin CMP and CBC within normal limits   Problem List / ED Course / Critical Interventions / Medication Management  Cough I have reviewed the patients home medicines and have made adjustments as needed. Discussed with patient.  All questions were answered. Sent a prescription for azithromycin to the pharmacy to take if patient develops fevers.   Social Determinants of Health  History of tobacco use   Test / Admission - Considered  Patient is stable and safe for discharge home. Return precautions provided.       Final Clinical Impression(s) / ED Diagnoses Final diagnoses:  URI with cough and congestion    Rx / DC Orders ED Discharge Orders          Ordered    azithromycin (ZITHROMAX) 250 MG tablet  Daily        01/31/24 0909               Maxwell Marion, PA-C 01/31/24 [id number]    Terald Sleeper, MD 01/31/24 [phone]
